# Patient Record
Sex: Female | Born: 1960 | Race: White | Hispanic: No | Marital: Married | Smoking: Never smoker
Health system: Southern US, Community
[De-identification: ages and names within clinical notes are randomized; demographics above are authoritative.]

## PROBLEM LIST (undated history)

## (undated) DIAGNOSIS — J189 Pneumonia, unspecified organism: Secondary | ICD-10-CM

## (undated) DIAGNOSIS — M3500 Sicca syndrome, unspecified: Secondary | ICD-10-CM

## (undated) DIAGNOSIS — I2510 Atherosclerotic heart disease of native coronary artery without angina pectoris: Secondary | ICD-10-CM

## (undated) HISTORY — DX: Sjogren syndrome, unspecified: M35.00

## (undated) HISTORY — DX: Pneumonia, unspecified organism: J18.9

## (undated) HISTORY — DX: Atherosclerotic heart disease of native coronary artery without angina pectoris: I25.10

---

## 1998-11-24 HISTORY — PX: NASAL SINUS SURGERY: SHX719

## 1999-01-25 ENCOUNTER — Other Ambulatory Visit: Admission: RE | Admit: 1999-01-25 | Discharge: 1999-01-25 | Payer: Self-pay | Admitting: *Deleted

## 1999-12-05 ENCOUNTER — Encounter (INDEPENDENT_AMBULATORY_CARE_PROVIDER_SITE_OTHER): Payer: Self-pay | Admitting: Specialist

## 1999-12-05 ENCOUNTER — Other Ambulatory Visit: Admission: RE | Admit: 1999-12-05 | Discharge: 1999-12-05 | Payer: Self-pay | Admitting: Otolaryngology

## 1999-12-13 ENCOUNTER — Inpatient Hospital Stay (HOSPITAL_COMMUNITY): Admission: EM | Admit: 1999-12-13 | Discharge: 1999-12-14 | Payer: Self-pay | Admitting: Emergency Medicine

## 2000-02-05 ENCOUNTER — Other Ambulatory Visit: Admission: RE | Admit: 2000-02-05 | Discharge: 2000-02-05 | Payer: Self-pay | Admitting: *Deleted

## 2000-07-13 ENCOUNTER — Encounter: Payer: Self-pay | Admitting: *Deleted

## 2000-07-13 ENCOUNTER — Encounter: Admission: RE | Admit: 2000-07-13 | Discharge: 2000-07-13 | Payer: Self-pay | Admitting: *Deleted

## 2000-09-29 ENCOUNTER — Ambulatory Visit: Admission: RE | Admit: 2000-09-29 | Discharge: 2000-09-29 | Payer: Self-pay | Admitting: Critical Care Medicine

## 2001-03-25 ENCOUNTER — Other Ambulatory Visit: Admission: RE | Admit: 2001-03-25 | Discharge: 2001-03-25 | Payer: Self-pay | Admitting: *Deleted

## 2001-08-26 ENCOUNTER — Encounter: Admission: RE | Admit: 2001-08-26 | Discharge: 2001-08-26 | Payer: Self-pay | Admitting: *Deleted

## 2001-08-26 ENCOUNTER — Encounter: Payer: Self-pay | Admitting: *Deleted

## 2001-11-26 ENCOUNTER — Encounter: Admission: RE | Admit: 2001-11-26 | Discharge: 2001-11-26 | Payer: Self-pay

## 2002-03-29 ENCOUNTER — Other Ambulatory Visit: Admission: RE | Admit: 2002-03-29 | Discharge: 2002-03-29 | Payer: Self-pay | Admitting: Obstetrics & Gynecology

## 2002-09-01 ENCOUNTER — Encounter: Payer: Self-pay | Admitting: Obstetrics & Gynecology

## 2002-09-01 ENCOUNTER — Encounter: Admission: RE | Admit: 2002-09-01 | Discharge: 2002-09-01 | Payer: Self-pay | Admitting: Obstetrics & Gynecology

## 2003-04-17 ENCOUNTER — Other Ambulatory Visit: Admission: RE | Admit: 2003-04-17 | Discharge: 2003-04-17 | Payer: Self-pay | Admitting: Obstetrics & Gynecology

## 2003-11-01 ENCOUNTER — Encounter: Admission: RE | Admit: 2003-11-01 | Discharge: 2003-11-01 | Payer: Self-pay | Admitting: Obstetrics & Gynecology

## 2004-12-04 ENCOUNTER — Encounter: Admission: RE | Admit: 2004-12-04 | Discharge: 2004-12-04 | Payer: Self-pay | Admitting: Obstetrics & Gynecology

## 2005-08-29 ENCOUNTER — Ambulatory Visit (HOSPITAL_COMMUNITY): Admission: RE | Admit: 2005-08-29 | Discharge: 2005-08-29 | Payer: Self-pay | Admitting: *Deleted

## 2005-08-29 ENCOUNTER — Encounter (INDEPENDENT_AMBULATORY_CARE_PROVIDER_SITE_OTHER): Payer: Self-pay | Admitting: Specialist

## 2005-12-30 ENCOUNTER — Encounter: Admission: RE | Admit: 2005-12-30 | Discharge: 2005-12-30 | Payer: Self-pay | Admitting: Obstetrics & Gynecology

## 2007-03-18 ENCOUNTER — Encounter: Admission: RE | Admit: 2007-03-18 | Discharge: 2007-03-18 | Payer: Self-pay | Admitting: Obstetrics & Gynecology

## 2007-03-22 ENCOUNTER — Ambulatory Visit (HOSPITAL_COMMUNITY): Admission: RE | Admit: 2007-03-22 | Discharge: 2007-03-22 | Payer: Self-pay | Admitting: *Deleted

## 2007-03-22 ENCOUNTER — Encounter (INDEPENDENT_AMBULATORY_CARE_PROVIDER_SITE_OTHER): Payer: Self-pay | Admitting: Specialist

## 2008-04-13 ENCOUNTER — Encounter: Admission: RE | Admit: 2008-04-13 | Discharge: 2008-04-13 | Payer: Self-pay | Admitting: Obstetrics & Gynecology

## 2009-04-16 ENCOUNTER — Encounter: Admission: RE | Admit: 2009-04-16 | Discharge: 2009-04-16 | Payer: Self-pay | Admitting: Obstetrics & Gynecology

## 2010-05-15 ENCOUNTER — Encounter: Admission: RE | Admit: 2010-05-15 | Discharge: 2010-05-15 | Payer: Self-pay | Admitting: Obstetrics & Gynecology

## 2011-04-11 NOTE — Op Note (Signed)
NAME:  Donna Fields, BOLDON NO.:  [id number]   MEDICAL RECORD NO.:  [id number]          PATIENT TYPE:  AMB   LOCATION:  ENDO                         FACILITY:  [facility]   PHYSICIAN:  Georgiana Spinner, M.D.    DATE OF BIRTH:  01-May-1961   DATE OF PROCEDURE:  03/22/2007  DATE OF DISCHARGE:                               OPERATIVE REPORT   PROCEDURE:  Upper endoscopy with biopsy.   INDICATIONS:  GERD.   ANESTHESIA:  Fentanyl 100 mcg, Versed 8 mg.   PROCEDURE:  With the patient mildly sedated in the left lateral  decubitus position, the Pentax videoscopic endoscope was inserted in the  mouth and passed under direct vision through the esophagus which  appeared normal on the first view.  On closer glance, there appeared to  be some areas that could be considered possibly Barrett's, but they were  photographed and biopsied.  We entered into the stomach fundus, body,  antrum, duodenal bulb, second portion duodenum appeared normal.  From  this point, the endoscope was slowly withdrawn taking circumferential  views of the duodenal mucosa until the endoscope had been pulled back in  the stomach and placed in retroflexion to view the stomach from below.  The endoscope was straightened and withdrawn taking circumferential  views of the remaining gastric and esophageal mucosa.  The patient's  vital signs and pulse oximeter remained stable.  The patient tolerated  procedure well without apparent complications.   FINDINGS:  Question of Barrett's esophagus, biopsied.  Await biopsy  report.  The patient will call me for results and follow-up with me as  an outpatient.           ______________________________  Georgiana Spinner, M.D.     GMO/MEDQ  D:  03/22/2007  T:  03/22/2007  Job:  [id number]

## 2011-04-11 NOTE — Op Note (Signed)
[place]  Patient:    Donna Fields                   MRN: [id number] Proc. Date: 12/13/99 Adm. Date:  [id number] Attending:  Barbee Cough                           Operative Report  PREOPERATIVE DIAGNOSES: 1. Postoperative hemorrhage. 2. Status post bilateral endoscopic sinus surgery (nasoseptoplasty and    turbinate cautery, performed on December 05, 1999).  POSTOPERATIVE DIAGNOSIS: 1. Postoperative hemorrhage. 2. Status post bilateral endoscopic sinus surgery (nasoseptoplasty and    turbinate cautery, performed on December 05, 1999).  SURGICAL PROCEDURE: 1. Endoscopic nasal examination. 2. Transeptal cautery. 3. Bilateral nasal packing.  ANESTHESIA:  General endotracheal.  SURGEON:  Kinnie Scales. Annalee Genta, M.D.  COMPLICATIONS:  None.  ESTIMATED BLOOD LOSS:  200 cc.  The patient transferred rom the operating room to the recovery room in stable condition.  BRIEF HISTORY:  [name] is a 50 year old white female with a history of chronic sinusitis and nasal obstruction.  She underwent nasoseptoplasty and inferior turbinate cautery on December 05, 1999 as an outpatient.  Surgical procedure was  performed without complication or difficulty and the patient was discharged to home. She did well for approximately five days after surgery, and then had several episodes of significant intermittent epistaxis -- primarily from the right hand  side.  The patient was seen in the office and postoperative packing was removed on December 12, 1999.  She awakened from sleep on the evening of December 12, 1999 with significant anterior bleeding.  Our office was contacted nd she was evaluated in emergency department.  There was heavy bleeding bilaterally from the nasal cavity as well as from the oropharynx.  Given the patients history, examination, and previous surgical history I recommended that we take her to operating room for exam and  management of epistaxis.  The risks, benefits and possible complications were discussed.  The patient and her husband understood nd concurred with our plans for surgery.  She was taken directly from the [facility] emergency department to the operating room.  DESCRIPTION OF PROCEDURE:  The patient was brought to the operating room on December 13, 1999.  General endotracheal anesthesia was established without difficulty.  When the patient had been adequately anesthetized, her nasal cavity was examined. She was found to have significant bleeding from the mid aspect of the right nasal cavity.  With a 0-degree endoscope the nasal cavity was thoroughly examined, and blood and clotted material was suctioned bilaterally.  There was a tear in the nasal septum mucosa on the right hand side in the mid aspect; this appeared to e the primary source of bleeding.  This was debrided and the specific bleeding site was not identifiable.  The bleeding appeared to be coming from the nasal septum and not from the sinuses.  Given this finding, the previously created left hemitransfixion incision was reopened and exploration was then undertaken along the mucoperichondrial flaps of the flaps of the nasoseptoplasty.  Dissection was carried from anterior to posterior, with heavy, copious bleeding.  In the posterior most aspect of the nasal septum there was an arterial bleeding source, coming primarily from the left hand side consistent with a sphenopalatine perforating artery.  This area was suctioned.  Suction cautery was then performed and Surgicel was placed over the bleeding site.  Mucoperichondrial flaps were then  re-approximated.  Nasal cavity was suctioned, and bilateral 10 cm Merocel packs  were then placed after application of Bactroban ointment, to ______ the nasal septum and to help tamponade the bleeding source.  The patients oral cavity and  oropharynx were then suctioned, and an  orogastric tube was passed and the stomach contents were aspirated.  She was awakened from her anesthetic, extubated without difficulty, and then transferred from the operating room to the recovery room in stable condition. DD:  12/13/99 TD:  12/13/99 Job: [id number]

## 2011-04-11 NOTE — Op Note (Signed)
NAME:  Donna Fields, BACKHAUS NO.:  [id number]   MEDICAL RECORD NO.:  [id number]          PATIENT TYPE:  AMB   LOCATION:  ENDO                         FACILITY:  [facility]   PHYSICIAN:  Georgiana Spinner, M.D.    DATE OF BIRTH:  31-May-1961   DATE OF PROCEDURE:  08/29/2005  DATE OF DISCHARGE:                                 OPERATIVE REPORT   PROCEDURE:  Upper endoscopy.   INDICATIONS:  Gastroesophageal reflux disease.   ANESTHESIA:  Demerol 50, Versed 6 mg.   DESCRIPTION OF PROCEDURE:  With the patient mildly sedated in the left  lateral decubitus position, the Olympus videoscopic endoscope was inserted  in the mouth and passed under direct vision through the esophagus which  appeared normal until we reached the distal esophagus and there was a  question of Barrett's photographed and biopsied. We entered into the  stomach. The fundus, body, antrum, duodenal bulb and second portion of the  duodenum were visualized. From this point, the endoscope was slowly  withdrawn taking circumferential views of the duodenal mucosa until the  endoscope had been pulled back into the stomach, placed in retroflexion to  view the stomach from below. The endoscope was straightened and withdrawn  taking circumferential views of the remaining gastric and esophageal mucosa.  The patient's vital signs and pulse oximeter remained stable. The patient  tolerated the procedure well without apparent complications.   FINDINGS:  Question of Barrett's esophagus biopsied. Await biopsy report.  The patient will call me for results and follow-up with me as an outpatient.           ______________________________  Georgiana Spinner, M.D.     GMO/MEDQ  D:  08/29/2005  T:  08/29/2005  Job:  [id number]

## 2011-04-25 ENCOUNTER — Other Ambulatory Visit: Payer: Self-pay | Admitting: Obstetrics & Gynecology

## 2011-04-25 DIAGNOSIS — Z1231 Encounter for screening mammogram for malignant neoplasm of breast: Secondary | ICD-10-CM

## 2011-05-13 ENCOUNTER — Emergency Department (HOSPITAL_COMMUNITY)
Admission: EM | Admit: 2011-05-13 | Discharge: 2011-05-14 | Disposition: A | Discharge status: Home / Self Care | Payer: 59 | Attending: Emergency Medicine | Admitting: Emergency Medicine

## 2011-05-13 DIAGNOSIS — M329 Systemic lupus erythematosus, unspecified: Secondary | ICD-10-CM | POA: Insufficient documentation

## 2011-05-13 DIAGNOSIS — M3500 Sicca syndrome, unspecified: Secondary | ICD-10-CM | POA: Insufficient documentation

## 2011-05-13 DIAGNOSIS — Z79899 Other long term (current) drug therapy: Secondary | ICD-10-CM | POA: Insufficient documentation

## 2011-05-13 DIAGNOSIS — J3489 Other specified disorders of nose and nasal sinuses: Secondary | ICD-10-CM | POA: Insufficient documentation

## 2011-05-13 DIAGNOSIS — J45909 Unspecified asthma, uncomplicated: Secondary | ICD-10-CM | POA: Insufficient documentation

## 2011-05-13 DIAGNOSIS — R040 Epistaxis: Secondary | ICD-10-CM | POA: Insufficient documentation

## 2011-05-19 ENCOUNTER — Ambulatory Visit
Admission: RE | Admit: 2011-05-19 | Discharge: 2011-05-19 | Disposition: A | Discharge status: Home / Self Care | Payer: 59 | Source: Ambulatory Visit | Attending: Obstetrics & Gynecology | Admitting: Obstetrics & Gynecology

## 2011-05-19 DIAGNOSIS — Z1231 Encounter for screening mammogram for malignant neoplasm of breast: Secondary | ICD-10-CM

## 2012-06-29 ENCOUNTER — Other Ambulatory Visit: Payer: Self-pay | Admitting: Obstetrics & Gynecology

## 2012-06-29 DIAGNOSIS — Z1231 Encounter for screening mammogram for malignant neoplasm of breast: Secondary | ICD-10-CM

## 2012-07-02 ENCOUNTER — Ambulatory Visit
Admission: RE | Admit: 2012-07-02 | Discharge: 2012-07-02 | Disposition: A | Discharge status: Home / Self Care | Payer: 59 | Source: Ambulatory Visit | Attending: Obstetrics & Gynecology | Admitting: Obstetrics & Gynecology

## 2012-07-02 DIAGNOSIS — Z1231 Encounter for screening mammogram for malignant neoplasm of breast: Secondary | ICD-10-CM

## 2012-07-07 ENCOUNTER — Other Ambulatory Visit: Payer: Self-pay | Admitting: Obstetrics & Gynecology

## 2012-07-07 DIAGNOSIS — R928 Other abnormal and inconclusive findings on diagnostic imaging of breast: Secondary | ICD-10-CM

## 2012-07-14 ENCOUNTER — Ambulatory Visit
Admission: RE | Admit: 2012-07-14 | Discharge: 2012-07-14 | Disposition: A | Discharge status: Home / Self Care | Payer: 59 | Source: Ambulatory Visit | Attending: Obstetrics & Gynecology | Admitting: Obstetrics & Gynecology

## 2012-07-14 ENCOUNTER — Ambulatory Visit
Admission: RE | Admit: 2012-07-14 | Discharge: 2012-07-14 | Disposition: A | Discharge status: Home / Self Care | Payer: 59 | Source: Ambulatory Visit

## 2012-07-14 DIAGNOSIS — R928 Other abnormal and inconclusive findings on diagnostic imaging of breast: Secondary | ICD-10-CM

## 2012-07-20 ENCOUNTER — Other Ambulatory Visit: Payer: Self-pay | Admitting: Obstetrics & Gynecology

## 2012-07-20 DIAGNOSIS — R928 Other abnormal and inconclusive findings on diagnostic imaging of breast: Secondary | ICD-10-CM

## 2012-10-13 ENCOUNTER — Other Ambulatory Visit (HOSPITAL_COMMUNITY): Payer: Self-pay | Admitting: Internal Medicine

## 2012-10-13 DIAGNOSIS — R0602 Shortness of breath: Secondary | ICD-10-CM

## 2012-10-19 ENCOUNTER — Ambulatory Visit (HOSPITAL_COMMUNITY)
Admission: RE | Admit: 2012-10-19 | Discharge: 2012-10-19 | Disposition: A | Discharge status: Home / Self Care | Payer: 59 | Source: Ambulatory Visit | Attending: Internal Medicine | Admitting: Internal Medicine

## 2012-10-19 DIAGNOSIS — R0602 Shortness of breath: Secondary | ICD-10-CM | POA: Insufficient documentation

## 2012-10-19 MED ORDER — ALBUTEROL SULFATE (5 MG/ML) 0.5% IN NEBU
2.5000 mg | INHALATION_SOLUTION | Freq: Once | RESPIRATORY_TRACT | Status: AC
  Administered 2012-10-19: 2.5 mg via RESPIRATORY_TRACT

## 2013-06-13 ENCOUNTER — Other Ambulatory Visit: Payer: Self-pay

## 2013-06-13 DIAGNOSIS — Z1231 Encounter for screening mammogram for malignant neoplasm of breast: Secondary | ICD-10-CM

## 2013-07-05 ENCOUNTER — Ambulatory Visit
Admission: RE | Admit: 2013-07-05 | Discharge: 2013-07-05 | Disposition: A | Discharge status: Home / Self Care | Payer: 59 | Source: Ambulatory Visit

## 2013-07-05 DIAGNOSIS — Z1231 Encounter for screening mammogram for malignant neoplasm of breast: Secondary | ICD-10-CM

## 2014-06-16 ENCOUNTER — Other Ambulatory Visit: Payer: Self-pay

## 2014-06-16 DIAGNOSIS — Z1231 Encounter for screening mammogram for malignant neoplasm of breast: Secondary | ICD-10-CM

## 2014-07-12 ENCOUNTER — Ambulatory Visit
Admission: RE | Admit: 2014-07-12 | Discharge: 2014-07-12 | Disposition: A | Discharge status: Home / Self Care | Payer: 59 | Source: Ambulatory Visit

## 2014-07-12 DIAGNOSIS — Z1231 Encounter for screening mammogram for malignant neoplasm of breast: Secondary | ICD-10-CM

## 2015-07-04 ENCOUNTER — Other Ambulatory Visit: Payer: Self-pay

## 2015-07-04 DIAGNOSIS — Z1231 Encounter for screening mammogram for malignant neoplasm of breast: Secondary | ICD-10-CM

## 2015-08-14 ENCOUNTER — Ambulatory Visit
Admission: RE | Admit: 2015-08-14 | Discharge: 2015-08-14 | Disposition: A | Discharge status: Home / Self Care | Payer: BC Managed Care – PPO | Source: Ambulatory Visit

## 2015-08-14 DIAGNOSIS — Z1231 Encounter for screening mammogram for malignant neoplasm of breast: Secondary | ICD-10-CM

## 2015-10-11 ENCOUNTER — Other Ambulatory Visit: Payer: Self-pay | Admitting: Surgery

## 2015-10-29 ENCOUNTER — Other Ambulatory Visit: Payer: Self-pay | Admitting: Endocrinology

## 2015-10-29 DIAGNOSIS — R918 Other nonspecific abnormal finding of lung field: Secondary | ICD-10-CM

## 2015-10-29 DIAGNOSIS — R059 Cough, unspecified: Secondary | ICD-10-CM

## 2015-10-29 DIAGNOSIS — R05 Cough: Secondary | ICD-10-CM

## 2015-11-06 ENCOUNTER — Other Ambulatory Visit: Payer: Self-pay | Admitting: Gastroenterology

## 2015-11-07 ENCOUNTER — Ambulatory Visit
Admission: RE | Admit: 2015-11-07 | Discharge: 2015-11-07 | Disposition: A | Discharge status: Home / Self Care | Payer: BC Managed Care – PPO | Source: Ambulatory Visit | Attending: Endocrinology | Admitting: Endocrinology

## 2015-11-07 DIAGNOSIS — R918 Other nonspecific abnormal finding of lung field: Secondary | ICD-10-CM

## 2015-11-07 DIAGNOSIS — R059 Cough, unspecified: Secondary | ICD-10-CM

## 2015-11-07 DIAGNOSIS — R05 Cough: Secondary | ICD-10-CM

## 2015-11-07 MED ORDER — IOPAMIDOL (ISOVUE-300) INJECTION 61%
75.0000 mL | Freq: Once | INTRAVENOUS | Status: AC | PRN
  Administered 2015-11-07: 75 mL via INTRAVENOUS

## 2015-11-09 ENCOUNTER — Encounter: Payer: Self-pay | Admitting: Internal Medicine

## 2015-11-09 ENCOUNTER — Ambulatory Visit (INDEPENDENT_AMBULATORY_CARE_PROVIDER_SITE_OTHER): Payer: BC Managed Care – PPO | Admitting: Internal Medicine

## 2015-11-09 VITALS — BP 118/88 | HR 111 | Ht 63.0 in | Wt 178.0 lb

## 2015-11-09 DIAGNOSIS — J842 Lymphoid interstitial pneumonia: Secondary | ICD-10-CM | POA: Diagnosis not present

## 2015-11-09 NOTE — Progress Notes (Signed)
Subjective:    Patient ID: Donna Fields, female    DOB: 08-Feb-1961,    MRN: [id number]  HPI   37 yowf never smoker prev eval in pulmonary for sob but not in EMR era for doe with neg findings per pt except for obesity/decondtioning  Referred back to pulmonary clinic by Dr [name]  11/09/2015 for progressively worse ex sob/abn cxr c/w sjogren's syndrome with assoc ILD    11/09/2015 1st Donna Fields/ [facility]   Chief Complaint  Patient presents with  . Pulmonary Consult    Referred by Dr. [name] for eval of pulmonary nodules. Pt states that she has been SOB "for years"- worse with exertion and exposure to extreme hot or cold temps.   dx'd with Sjogren's around 2000 by Dr Justine Null main symptom was fatigue/ dry mouth rx plaquenil/ now followed by [name]  Indolent onset progressively worsening dry Cough and doe x 5 years, Cough is worse lying down or with exercise.  Was able to slow jog at onset, now [facility] = can walk nl pace, flat grade, can't hurry or go uphills or steps s sob , worse in cold air   No obvious other patterns in day to day or daytime variabilty or assoc  cp or chest tightness, subjective wheeze overt sinus or hb symptoms. No unusual exp hx or h/o childhood pna/ asthma or knowledge of premature birth.  Sleeping ok without nocturnal  or early am exacerbation  of respiratory  c/o's or need for noct saba. Also denies any obvious fluctuation of symptoms with weather or environmental changes or other aggravating or alleviating factors except as outlined above   Current Medications, Allergies, Complete Past Medical History, Past Surgical History, Family History, and Social History were reviewed in [employer] record.               Review of Systems  Constitutional: Negative for fever, chills and unexpected weight change.  HENT: Negative for congestion, dental problem, ear pain, nosebleeds, postnasal drip, rhinorrhea, sinus pressure,  sneezing, sore throat, trouble swallowing and voice change.   Eyes: Negative for visual disturbance.  Respiratory: Positive for cough and shortness of breath. Negative for choking.   Cardiovascular: Negative for chest pain and leg swelling.  Gastrointestinal: Negative for vomiting, abdominal pain and diarrhea.  Genitourinary: Negative for difficulty urinating.  Musculoskeletal: Negative for arthralgias.  Skin: Negative for rash.  Neurological: Negative for tremors, syncope and headaches.  Hematological: Does not bruise/bleed easily.       Objective:   Physical Exam amb wm nad   Wt Readings from Last 3 Encounters:  11/09/15 178 lb (80.74 kg)    Vital signs reviewed   HEENT: nl dentition, turbinates, and oropharynx. Nl external ear canals without cough reflex   NECK :  without JVD/Nodes/TM/ nl carotid upstrokes bilaterally   LUNGS: no acc muscle use,  Nl contour chest which is clear to A and P bilaterally without cough on insp or exp maneuvers   CV:  RRR  no s3 or murmur or increase in P2, no edema   ABD:  soft and nontender with nl inspiratory excursion in the supine position. No bruits or organomegaly, bowel sounds nl  MS:  Nl gait/ ext warm without deformities, calf tenderness, cyanosis or clubbing No obvious joint restrictions   SKIN: warm and dry without lesions    NEURO:  alert, approp, nl sensorium with  no motor deficits      I personally  reviewed images and agree with radiology impression as follows:  CT Chest   11/07/15  Multiple thin wall pulmonary cysts of varying sizes with slight mid to lower lung predominance as well as scattered bilateral nodular opacities. 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

## 2015-11-09 NOTE — Patient Instructions (Addendum)
Take prilosec 20 mg Take 30-60 min before first meal of the day every day until you return   GERD (REFLUX)  is an extremely common cause of respiratory symptoms just like yours , many times with no obvious heartburn at all.    It can be treated with medication, but also with lifestyle changes including elevation of the head of your bed (ideally with 6 inch  bed blocks),  Smoking cessation, avoidance of late meals, excessive alcohol, and avoid fatty foods, chocolate, peppermint, colas, red wine, and acidic juices such as orange juice.  NO MINT OR MENTHOL PRODUCTS SO NO COUGH DROPS  USE SUGARLESS CANDY INSTEAD (Jolley ranchers or Stover's or Life Savers) or even ice chips will also do - the key is to swallow to prevent all throat clearing. NO OIL BASED VITAMINS - use powdered substitutes.    Please schedule a follow up office visit in 6 weeks, call sooner if needed - pft's and walking sats on return

## 2015-11-10 ENCOUNTER — Encounter: Payer: Self-pay | Admitting: Internal Medicine

## 2015-11-10 DIAGNOSIS — J842 Lymphoid interstitial pneumonia: Secondary | ICD-10-CM | POA: Insufficient documentation

## 2015-11-10 MED ORDER — OMEPRAZOLE 20 MG PO CPDR: 20.0000 mg | DELAYED_RELEASE_CAPSULE | Freq: Every day | ORAL | Status: DC

## 2015-11-10 NOTE — Assessment & Plan Note (Addendum)
Onset of Sjogren's in 2000/ maint rx plaquenil / f/u by [facility] - see CT chest 11/07/15   .  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  There is a risk of cyst rupture with extremes of altitude (not commercial flight typically but certainly with scuba diving) but little else of concern in the short run.  She should return p holidays for full pfts to quantify severity of underlying ILD and r/o any sign airflow obst which might be potentially reversible.  In addition, Use of PPI is associated with improved survival time and with decreased radiologic fibrosis per King's study published in [facility] vol 184 p1390.  Dec 2011 and also may have other beneficial effects as per the latest review in [facility] vol 193 [id number] Jun 20016.  This may not always be cause and effect, but given how universally unimpressive and expensive  all the other  Drugs developed to day  have been for pf,   rec start  rx ppi / diet/ lifestyle modification and f/u with serial walking sats and lung volumes for now to put more points on the curve / establish firm baseline before considering additional measures.   Total time devoted to counseling  = 35/22m ov :  review case with pt/ discussion of options/alternatives/ giving and going over instructions (see avs)

## 2015-11-12 ENCOUNTER — Telehealth: Payer: Self-pay | Admitting: *Deleted

## 2015-11-12 DIAGNOSIS — J842 Lymphoid interstitial pneumonia: Secondary | ICD-10-CM

## 2015-11-12 NOTE — Telephone Encounter (Signed)
-----   Message from Tanda Rockers, MD sent at 11/10/2015  7:33 AM EST ----- Meant to order pfts on return

## 2015-11-12 NOTE — Telephone Encounter (Signed)
LMTCB

## 2015-11-13 NOTE — Telephone Encounter (Signed)
pft schedule in office for that day is 100% booked.  Will have to be done at hospital. lmtcb X2 for pt. Will order pft after speaking to pt.

## 2015-11-14 NOTE — Telephone Encounter (Signed)
LMTCB for pt 

## 2015-11-15 NOTE — Telephone Encounter (Signed)
LMTCB for pt on VM

## 2015-11-16 NOTE — Telephone Encounter (Signed)
Called and spoke to pt. Informed her of the need for PFTs. Pt scheduled for full PFT on 12/21/2015, pt has appt with MW on 12/24/15. Pt verbalized understanding and denied any further questions or concerns at this time.

## 2015-12-11 ENCOUNTER — Institutional Professional Consult (permissible substitution): Payer: BC Managed Care – PPO | Admitting: Internal Medicine

## 2015-12-20 ENCOUNTER — Ambulatory Visit (INDEPENDENT_AMBULATORY_CARE_PROVIDER_SITE_OTHER): Payer: BC Managed Care – PPO | Admitting: Internal Medicine

## 2015-12-20 DIAGNOSIS — J842 Lymphoid interstitial pneumonia: Secondary | ICD-10-CM | POA: Diagnosis not present

## 2015-12-20 DIAGNOSIS — J449 Chronic obstructive pulmonary disease, unspecified: Secondary | ICD-10-CM

## 2015-12-20 HISTORY — DX: Chronic obstructive pulmonary disease, unspecified: J44.9

## 2015-12-20 LAB — PULMONARY FUNCTION TEST
DL/VA % PRED: 85 %
DL/VA: 4.01 ml/min/mmHg/L
DLCO UNC % PRED: 78 %
DLCO UNC: 18.09 ml/min/mmHg
FEF 25-75 POST: 1.69 L/s
FEF 25-75 PRE: 1.2 L/s
FEF2575-%Change-Post: 40 %
FEF2575-%PRED-POST: 68 %
FEF2575-%Pred-Pre: 49 %
FEV1-%Change-Post: 7 %
FEV1-%Pred-Post: 79 %
FEV1-%Pred-Pre: 73 %
FEV1-POST: 2.03 L
FEV1-Pre: 1.88 L
FEV1FVC-%Change-Post: 5 %
FEV1FVC-%Pred-Pre: 91 %
FEV6-%CHANGE-POST: 2 %
FEV6-%PRED-POST: 83 %
FEV6-%Pred-Pre: 81 %
FEV6-Post: 2.67 L
FEV6-Pre: 2.59 L
FEV6FVC-%CHANGE-POST: 1 %
FEV6FVC-%Pred-Post: 102 %
FEV6FVC-%Pred-Pre: 101 %
FVC-%Change-Post: 1 %
FVC-%PRED-POST: 81 %
FVC-%Pred-Pre: 79 %
FVC-Post: 2.67 L
FVC-Pre: 2.62 L
POST FEV1/FVC RATIO: 76 %
PRE FEV1/FVC RATIO: 72 %
Post FEV6/FVC ratio: 100 %
Pre FEV6/FVC Ratio: 99 %
RV % PRED: 137 %
RV: 2.55 L
TLC % pred: 105 %
TLC: 5.17 L

## 2015-12-20 NOTE — Progress Notes (Signed)
PFT done today. 

## 2015-12-24 ENCOUNTER — Encounter: Payer: Self-pay | Admitting: Internal Medicine

## 2015-12-24 ENCOUNTER — Ambulatory Visit (INDEPENDENT_AMBULATORY_CARE_PROVIDER_SITE_OTHER): Payer: BC Managed Care – PPO | Admitting: Internal Medicine

## 2015-12-24 VITALS — BP 130/84 | HR 96 | Ht 63.0 in | Wt 182.2 lb

## 2015-12-24 DIAGNOSIS — J842 Lymphoid interstitial pneumonia: Secondary | ICD-10-CM

## 2015-12-24 NOTE — Progress Notes (Signed)
Subjective:    Patient ID: Donna Fields, female    DOB: 1961-10-02    MRN: [id number]     Brief patient profile:  1 yowf never smoker prev eval in pulmonary for sob but not in EMR era for doe with neg findings per pt except for obesity/decondtioning  Referred back to pulmonary clinic by Dr Donna Fields  11/09/2015 for progressively worse ex sob/abn cxr c/w sjogren's syndrome with assoc ILD     History of Present Illness  11/09/2015 1st [facility] office visit/ Donna Fields   Chief Complaint  Patient presents with  . Pulmonary Consult    Referred by Dr. Jani Fields for eval of pulmonary nodules. Pt states that she has been SOB "for years"- worse with exertion and exposure to extreme hot or cold temps.   dx'd with Sjogren's around 2000 by Dr Donna Fields main symptom was fatigue/ dry mouth rx plaquenil/ now followed by Donna Fields  Indolent onset progressively worsening dry Cough and doe x 5 years, Cough is worse lying down or with exercise.  Was able to slow jog at onset, now [facility] = can walk nl pace, flat grade, can't hurry or go uphills or steps s sob , worse in cold air  rec Take prilosec 20 mg Take 30-60 min before first meal of the day every day until you return  GERD  Diet   12/24/2015  f/u ov/Donna Fields re:  LIP/ ? ILD from sjogren's maint on plaquenil  Chief Complaint  Patient presents with  . Follow-up    Breathing is unchanged. No new co's today.    Not limited by breathing from desired activities    No obvious day to day or daytime variability or assoc chronic cough or cp or chest tightness, subjective wheeze or overt sinus or hb symptoms. No unusual exp hx or h/o childhood pna/ asthma or knowledge of premature birth.  Sleeping ok without nocturnal  or early am exacerbation  of respiratory  c/o's or need for noct saba. Also denies any obvious fluctuation of symptoms with weather or environmental changes or other aggravating or alleviating factors except as outlined above   Current  Medications, Allergies, Complete Past Medical History, Past Surgical History, Family History, and Social History were reviewed in [employer] record.  ROS  The following are not active complaints unless bolded sore throat, dysphagia, dental problems, itching, sneezing,  nasal congestion or excess/ purulent secretions, ear ache,   fever, chills, sweats, unintended wt loss, classically pleuritic or exertional cp, hemoptysis,  orthopnea pnd or leg swelling, presyncope, palpitations, abdominal pain, anorexia, nausea, vomiting, diarrhea  or change in bowel or bladder habits, change in stools or urine, dysuria,hematuria,  rash, arthralgias, visual complaints, headache, numbness, weakness or ataxia or problems with walking or coordination,  change in mood/affect or memory.                Objective:   Physical Exam   amb wm nad   Wt Readings from Last 3 Encounters:  12/24/15 182 lb 3.2 oz (82.645 kg)  11/09/15 178 lb (80.74 kg)    Vital signs reviewed   HEENT: nl dentition, turbinates, and oropharynx. Nl external ear canals without cough reflex   NECK :  without JVD/Nodes/TM/ nl carotid upstrokes bilaterally   LUNGS: no acc muscle use,  Nl contour chest which is clear to A and P bilaterally without cough on insp or exp maneuvers   CV:  RRR  no s3 or murmur or increase  in P2, no edema   ABD:  soft and nontender with nl inspiratory excursion in the supine position. No bruits or organomegaly, bowel sounds nl  MS:  Nl gait/ ext warm without deformities, calf tenderness, cyanosis or clubbing No obvious joint restrictions   SKIN: warm and dry without lesions    NEURO:  alert, approp, nl sensorium with  no motor deficits      I personally reviewed images and agree with radiology impression as follows:  CT Chest   11/07/15  Multiple thin wall pulmonary cysts of varying sizes with slight mid to lower lung predominance as well as scattered bilateral  nodular opacities. 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

## 2015-12-24 NOTE — Patient Instructions (Signed)
Ok to stop prilosec   Weight control is simply a matter of calorie balance which needs to be tilted in your favor by eating less and exercising more.  To get the most out of exercise, you need to be continuously aware that you are short of breath, but never out of breath, for 30 minutes daily. As you improve, it will actually be easier for you to do the same amount of exercise  in  30 minutes so always push to the level where you are short of breath.  If this does not result in gradual weight reduction then I strongly recommend you see a nutritionist with a food diary x 2 weeks so that we can work out a negative calorie balance which is universally effective in steady weight loss programs.  Think of your calorie balance like you do your bank account where in this case you want the balance to go down so you must take in less calories than you burn up.  It's just that simple:  Hard to do, but easy to understand.  Good luck!   Please schedule a follow up visit in  6 months but call sooner if needed and with pft's and cxr

## 2015-12-26 ENCOUNTER — Encounter: Payer: Self-pay | Admitting: Internal Medicine

## 2015-12-26 NOTE — Assessment & Plan Note (Signed)
Onset of Sjogren's in 2000/ maint rx plaquenil / f/u by [facility] -CT Chest   11/07/15  Multiple thin wall pulmonary cysts of varying sizes with slight mid to lower lung predominance as well as scattered bilateral nodular opacities. 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  Clinically she's doing fine.   rec repeat pfts/ cxr in 6 m then prn sob    I had an extended discussion with the patient reviewing all relevant studies completed to date and  lasting 15 to 20 minutes of a 25 minute visit on the following ongoing concerns:   Discussed in detail all the  indications, usual  risks and alternatives  relative to the benefits with patient who agrees to proceed with conservative f/u as outlined

## 2015-12-27 ENCOUNTER — Telehealth: Payer: Self-pay | Admitting: *Deleted

## 2015-12-27 ENCOUNTER — Telehealth: Payer: Self-pay | Admitting: Internal Medicine

## 2015-12-27 NOTE — Telephone Encounter (Signed)
error 

## 2015-12-27 NOTE — Telephone Encounter (Signed)
We need to know who her rheum is so that we can send her last note  I spoke with the pt's spouse  He will find out and call us tomorrow  He states that the pt was upset after her last visit b/c she feels that not enough time was spent with her  Will forward to MW so he is aware

## 2015-12-27 NOTE — Telephone Encounter (Signed)
Discussed with husband / pt upset re internet search, reassured that's not necessarily applicable to her case and we'll know more when she returns at the 6 m mark and welcomed him to be there.

## 2016-06-23 ENCOUNTER — Ambulatory Visit: Payer: BC Managed Care – PPO | Admitting: Internal Medicine

## 2016-06-30 ENCOUNTER — Ambulatory Visit: Payer: BC Managed Care – PPO | Admitting: Internal Medicine

## 2016-07-25 ENCOUNTER — Ambulatory Visit (INDEPENDENT_AMBULATORY_CARE_PROVIDER_SITE_OTHER)
Admission: RE | Admit: 2016-07-25 | Discharge: 2016-07-25 | Disposition: A | Discharge status: Home / Self Care | Payer: BC Managed Care – PPO | Source: Ambulatory Visit | Attending: Internal Medicine | Admitting: Internal Medicine

## 2016-07-25 ENCOUNTER — Other Ambulatory Visit: Payer: Self-pay | Admitting: Internal Medicine

## 2016-07-25 ENCOUNTER — Encounter (INDEPENDENT_AMBULATORY_CARE_PROVIDER_SITE_OTHER): Payer: BC Managed Care – PPO | Admitting: Internal Medicine

## 2016-07-25 ENCOUNTER — Encounter: Payer: Self-pay | Admitting: Internal Medicine

## 2016-07-25 ENCOUNTER — Ambulatory Visit (INDEPENDENT_AMBULATORY_CARE_PROVIDER_SITE_OTHER): Payer: BC Managed Care – PPO | Admitting: Internal Medicine

## 2016-07-25 VITALS — BP 130/86 | HR 90 | Ht 63.0 in | Wt 168.0 lb

## 2016-07-25 DIAGNOSIS — J842 Lymphoid interstitial pneumonia: Secondary | ICD-10-CM | POA: Diagnosis not present

## 2016-07-25 DIAGNOSIS — R0600 Dyspnea, unspecified: Secondary | ICD-10-CM

## 2016-07-25 DIAGNOSIS — J45991 Cough variant asthma: Secondary | ICD-10-CM | POA: Diagnosis not present

## 2016-07-25 LAB — NITRIC OXIDE: NITRIC OXIDE: 111

## 2016-07-25 MED ORDER — BUDESONIDE-FORMOTEROL FUMARATE 80-4.5 MCG/ACT IN AERO: INHALATION_SPRAY | RESPIRATORY_TRACT | 11 refills | Status: DC

## 2016-07-25 MED ORDER — BUDESONIDE-FORMOTEROL FUMARATE 80-4.5 MCG/ACT IN AERO: 2.0000 | INHALATION_SPRAY | Freq: Two times a day (BID) | RESPIRATORY_TRACT | 0 refills | Status: DC

## 2016-07-25 NOTE — Patient Instructions (Signed)
Start symbicort 80 Take 2 puffs first thing in am and then another 2 puffs about 12 hours later.   Work on inhaler technique:  relax and gently blow all the way out then take a nice smooth deep breath back in, triggering the inhaler at same time you start breathing in.  Hold for up to 5 seconds if you can. Blow out thru nose. Rinse and gargle with water when done   Please remember to go to the  x-ray department downstairs for your tests - we will call you with the results when they are available.       Please schedule a follow up visit in 3 months but call sooner if needed

## 2016-07-25 NOTE — Progress Notes (Signed)
Subjective:    Patient ID: Donna Fields, female    DOB: [birth year]    MRN: [id number]     Brief patient profile:  15 yowf never smoker prev eval in pulmonary for sob but not in EMR era for doe with neg findings per pt except for obesity/decondtioning  Referred back to pulmonary clinic by Dr [name]  11/09/2015 for progressively worse ex sob/abn cxr c/w sjogren's syndrome with assoc ILD     History of Present Illness  11/09/2015 1st [place] [facility] office visit/ Aleli Navedo   Chief Complaint  Patient presents with  . Pulmonary Consult    Referred by Dr. [name] for eval of pulmonary nodules. Pt states that she has been SOB "for years"- worse with exertion and exposure to extreme hot or cold temps.   dx'd with Sjogren's around 2000 by Dr Justine Null main symptom was fatigue/ dry mouth rx plaquenil/ now followed by [name]  Indolent onset progressively worsening dry Cough and doe x 5 years, Cough is worse lying down or with exercise.  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 Follow-up    Breathing is unchanged. No new co's today.   Not limited by breathing from desired activities   rec Ok to stop prilosec     07/25/2016  f/u ov/Kimber Fritts re: LIP/ / ILD  Chief Complaint  Patient presents with  . Follow-up    PFT's done today. Breathing is unchanged. She has occ non prod cough and chest tightness.  She uses albuterol inhaler 2-3 x per wk on average.   stopped plaquenil months prior to OV  And reduced the prilosec to qod Can not vacuum even short spurts s sob which is new x 6 months, gradually worse/ never tried saba before exertion No noct symptoms, cough more with  exertion in or outside    No obvious day to day or daytime variability or assoc chronic cough or cp or chest tightness, subjective wheeze or overt sinus or hb symptoms. No unusual exp hx or h/o childhood pna/ asthma or knowledge of premature birth.  Sleeping ok without nocturnal  or early am exacerbation  of respiratory  c/o's or need for noct saba. Also denies any obvious fluctuation of symptoms with weather or environmental changes or other aggravating or alleviating factors except as outlined above   Current Medications, Allergies, Complete Past Medical History, Past Surgical History, Family History, and Social History were reviewed in [employer] record.  ROS  The following are not active complaints unless bolded sore throat, dysphagia, dental problems, itching, sneezing,  nasal congestion or excess/ purulent secretions, ear ache,   fever, chills, sweats, unintended wt loss, classically pleuritic or exertional cp, hemoptysis,  orthopnea pnd or leg swelling, presyncope, palpitations, abdominal pain, anorexia, nausea, vomiting, diarrhea  or change in bowel or bladder habits, change in stools or urine, dysuria,hematuria,  rash, arthralgias, visual complaints, headache, numbness, weakness or ataxia or problems with walking or coordination,  change in mood/affect or memory.  Objective:   Physical Exam   amb wm nad    07/25/2016        168   12/24/15 182 lb 3.2 oz (82.645 kg)  11/09/15 178 lb (80.74 kg)    Vital signs reviewed   HEENT: nl dentition, turbinates, and oropharynx. Nl external ear canals without cough reflex   NECK :  without JVD/Nodes/TM/ nl carotid upstrokes bilaterally   LUNGS: no acc muscle use,  Nl contour chest which is clear to A and P bilaterally without cough on insp or exp maneuvers   CV:  RRR  no s3 or murmur or increase in P2, no edema   ABD:  soft and nontender with nl inspiratory excursion in the supine position. No  bruits or organomegaly, bowel sounds nl  MS:  Nl gait/ ext warm without deformities, calf tenderness, cyanosis or clubbing No obvious joint restrictions   SKIN: warm and dry without lesions    NEURO:  alert, approp, nl sensorium with  no motor deficits      I personally reviewed images and agree with radiology impression as follows:  CT Chest   11/07/15  Multiple thin wall pulmonary cysts of varying sizes with slight mid to lower lung predominance as well as scattered bilateral nodular opacities. 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 No acute cardiopulmonary process is identified. 2. Cystic and nodular changes of lung parenchyma are similar to prior chest radiograph and better characterized on prior chest CT      Assessment & Plan:

## 2016-07-26 NOTE — Assessment & Plan Note (Addendum)
FENO 07/25/2016  =   111 -  07/25/2016  After extensive coaching HFA effectiveness =    75% > try symbicort 80 2bid  She improved over 200 cc p albuterol today and this plus the FENO elevation and the reported clinical improvement after saba all point to occult asthma with suggestion of eos airways inflammation so reasonable to challenge with maint rx  And see what difference this makes in her symptoms  .I had an extended discussion with the patient reviewing all relevant studies completed to date and  lasting  25 minutes of a 40  minute visit    Each maintenance medication was reviewed in detail including most importantly the difference between maintenance and prns and under what circumstances the prns are to be triggered using an action plan format that is not reflected in the computer generated alphabetically organized AVS.    Please see instructions for details which were reviewed in writing and the patient given a copy highlighting the part that I personally wrote and discussed at today's ov.

## 2016-07-29 NOTE — Progress Notes (Signed)
LMTCB

## 2016-07-30 ENCOUNTER — Telehealth: Payer: Self-pay | Admitting: Internal Medicine

## 2016-07-30 NOTE — Telephone Encounter (Signed)
Call pt: Reviewed cxr and no acute change so no change in recommendations made at [facility] and spoke with pt and she is aware of cxr results per MW.  Nothing further is needed.

## 2016-07-31 LAB — PULMONARY FUNCTION TEST
DL/VA % PRED: 86 %
DL/VA: 4.05 ml/min/mmHg/L
DLCO UNC % PRED: 77 %
DLCO UNC: 17.64 ml/min/mmHg
DLCO cor % pred: 78 %
DLCO cor: 17.92 ml/min/mmHg
FEF 25-75 PRE: 1.03 L/s
FEF 25-75 Post: 1.77 L/sec
FEF2575-%Change-Post: 71 %
FEF2575-%Pred-Post: 72 %
FEF2575-%Pred-Pre: 42 %
FEV1-%CHANGE-POST: 13 %
FEV1-%PRED-PRE: 64 %
FEV1-%Pred-Post: 73 %
FEV1-POST: 1.86 L
FEV1-Pre: 1.63 L
FEV1FVC-%Change-Post: 4 %
FEV1FVC-%Pred-Pre: 91 %
FEV6-%CHANGE-POST: 10 %
FEV6-%PRED-POST: 78 %
FEV6-%PRED-PRE: 70 %
FEV6-Post: 2.47 L
FEV6-Pre: 2.22 L
FEV6FVC-%CHANGE-POST: 1 %
FEV6FVC-%PRED-POST: 103 %
FEV6FVC-%PRED-PRE: 101 %
FVC-%Change-Post: 9 %
FVC-%Pred-Post: 75 %
FVC-%Pred-Pre: 69 %
FVC-Post: 2.47 L
POST FEV6/FVC RATIO: 100 %
Post FEV1/FVC ratio: 75 %
Pre FEV1/FVC ratio: 72 %
Pre FEV6/FVC Ratio: 98 %
RV % PRED: 134 %
RV: 2.51 L
TLC % pred: 98 %
TLC: 4.82 L

## 2016-08-14 ENCOUNTER — Telehealth: Payer: Self-pay | Admitting: Internal Medicine

## 2016-08-14 NOTE — Telephone Encounter (Signed)
Received fax from [place] stating that med was approved  Fax sent to pharm to inform them

## 2016-08-14 NOTE — Telephone Encounter (Signed)
PA initiated through [facility] for Symbicort 80 Key: [place] information has been submitted to [employer]. To check for an updated outcome later, reopen this PA request from your dashboard. If [employer] has not responded to your request within 24 hours, contact [place] at [phone]. If you think there may be a problem with your PA request, use our live chat feature at the bottom right.

## 2016-10-24 ENCOUNTER — Ambulatory Visit (INDEPENDENT_AMBULATORY_CARE_PROVIDER_SITE_OTHER): Payer: BC Managed Care – PPO | Admitting: Internal Medicine

## 2016-10-24 ENCOUNTER — Encounter: Payer: Self-pay | Admitting: Internal Medicine

## 2016-10-24 VITALS — BP 122/78 | HR 99 | Ht 63.0 in | Wt 173.4 lb

## 2016-10-24 DIAGNOSIS — J842 Lymphoid interstitial pneumonia: Secondary | ICD-10-CM | POA: Diagnosis not present

## 2016-10-24 DIAGNOSIS — J45991 Cough variant asthma: Secondary | ICD-10-CM

## 2016-10-24 NOTE — Progress Notes (Addendum)
Subjective:    Patient ID: Donna Fields, female    DOB: 11/13/1961    MRN: [id number]     Brief patient profile:  12 yowf never smoker prev eval in pulmonary for sob but not in EMR era for doe with neg findings per pt except for obesity/decondtioning   Referred back to pulmonary clinic by Dr [name]  11/09/2015 for progressively worse ex sob/abn cxr c/w sjogren's syndrome with assoc ILD     History of Present Illness  11/09/2015 1st [place] [facility] office visit/ Rai Sinagra   Chief Complaint  Patient presents with  . Pulmonary Consult    Referred by Dr. [name] for eval of pulmonary nodules. Pt states that she has been SOB "for years"- worse with exertion and exposure to extreme hot or cold temps.   dx'd with Sjogren's around 2000 by Dr Justine Null main symptom was fatigue/ dry mouth rx plaquenil/ now followed by [name]  Indolent onset progressively worsening dry Cough and doe x 5 years, Cough is worse lying down or with exercise.  Was able to slow jog at onset, now [facility] = can walk nl pace, flat grade, can't hurry or go uphills or steps s sob , worse in cold air  rec Take prilosec 20 mg Take 30-60 min before first meal of the day every day until you return  GERD  Diet   07/25/2016  f/u ov/Orvetta Danielski re: LIP/ / ILD  Chief Complaint  Patient presents with  . Follow-up    PFT's done today. Breathing is unchanged. She has occ non prod cough and chest tightness.  She uses albuterol inhaler 2-3 x per wk on average.   stopped plaquenil months prior to OV  And reduced the prilosec to qod Can not vacuum even short spurts s sob which is new x 6 months, gradually worse/ never tried saba before exertion No noct symptoms, cough more with exertion in or outside  rec Start symbicort 80 Take 2 puffs first thing in am and then another 2 puffs about 12 hours later.  Work on inhaler technique:  Please remember to go to the  x-ray department downstairs for your tests - we will call you with the results  when they are available.   10/24/2016  f/u ov/Shley Dolby re: LIP/ ILD  Now on symb 80 2bid for AB Chief Complaint  Patient presents with  . Follow-up    Currently on zithromax sinus infection. She states she coughing more and has noticed slight increase in SOB. She has had some green sputum and occ nose bleeds. She rarely uses her albuterol.    albuterol maybe once a month  - no change in ex tol =  MMRC2 = can't walk a nl pace on a flat grade s sob but does fine slow and flat eg shopping but not vacuuming  No obvious day to day or daytime variability or assoc chronic cough or cp or chest tightness, subjective wheeze or overt sinus or hb symptoms. No unusual exp hx or h/o childhood pna/ asthma or knowledge of premature birth.  Sleeping ok without nocturnal  or early am exacerbation  of respiratory  c/o's or need for noct saba. Also denies any obvious fluctuation of symptoms with weather or environmental changes or other aggravating or alleviating factors except as outlined above   Current Medications, Allergies, Complete Past Medical History, Past Surgical History, Family History, and Social History were reviewed in [employer] record.  ROS  The following are  not active complaints unless bolded sore throat, dysphagia, dental problems, itching, sneezing,  nasal congestion or excess/ purulent secretions, ear ache,   fever, chills, sweats, unintended wt loss, classically pleuritic or exertional cp, hemoptysis,  orthopnea pnd or leg swelling, presyncope, palpitations, abdominal pain, anorexia, nausea, vomiting, diarrhea  or change in bowel or bladder habits, change in stools or urine, dysuria,hematuria,  rash, arthralgias, visual complaints, headache, numbness, weakness or ataxia or problems with walking or coordination,  change in mood/affect or memory.                Objective:   Physical Exam   amb wm nad   10/24/2016       173   07/25/2016        168   12/24/15 182 lb 3.2  oz (82.645 kg)  11/09/15 178 lb (80.74 kg)    Vital signs reviewed - Note on arrival 02 sats  98% on RA    HEENT: nl dentition, turbinates, and oropharynx. Nl external ear canals without cough reflex   NECK :  without JVD/Nodes/TM/ nl carotid upstrokes bilaterally   LUNGS: no acc muscle use,  Nl contour chest which is clear to A and P bilaterally without cough on insp or exp maneuvers   CV:  RRR  no s3 or murmur or increase in P2, no edema   ABD:  soft and nontender with nl inspiratory excursion in the supine position. No bruits or organomegaly, bowel sounds nl  MS:  Nl gait/ ext warm without deformities, calf tenderness, cyanosis or clubbing No obvious joint restrictions   SKIN: warm and dry without lesions    NEURO:  alert, approp, nl sensorium with  no motor deficits      I personally reviewed images and agree with radiology impression as follows:  CXR:   07/25/16 1. No acute cardiopulmonary process is identified. 2. Cystic and nodular changes of lung parenchyma are similar to prior chest radiograph and better characterized on prior chest CT.          Assessment & Plan:

## 2016-10-24 NOTE — Patient Instructions (Signed)
No change in meds   Please schedule a follow up visit in 6  months but call sooner if needed with pfts

## 2016-10-25 ENCOUNTER — Encounter: Payer: Self-pay | Admitting: Internal Medicine

## 2016-10-25 NOTE — Assessment & Plan Note (Signed)
FENO 07/25/2016  =   111 -  07/25/2016   try symbicort 80 2bid    - The proper method of use, as well as anticipated side effects, of a metered-dose inhaler are discussed and demonstrated to the patient. Improved effectiveness after extensive coaching during this visit to a level of approximately 75 % from a baseline of 50 % > continue to work on optimal hfa  There is only a minimal asthmatic component here and could consider higher strength ics but this risks irritating the upper airway so instead just rec work on optimal hfa/ f/u with pfts as planned  I had an extended discussion with the patient reviewing all relevant studies completed to date and  lasting 15 to 20 minutes of a 25 minute visit    Each maintenance medication was reviewed in detail including most importantly the difference between maintenance and prns and under what circumstances the prns are to be triggered using an action plan format that is not reflected in the computer generated alphabetically organized AVS.    Please see AVS for unique instructions that I personally wrote and verbalized to the the pt in detail and then reviewed with pt  by my nurse highlighting any  changes in therapy recommended at today's visit to their plan of care.

## 2016-10-25 NOTE — Assessment & Plan Note (Signed)
Onset of Sjogren's in 2000/ maint rx plaquenil / f/u by [facility] > d/c spring 2017 -CT Chest   11/07/15  Multiple thin wall pulmonary cysts of varying sizes with slight mid to lower lung predominance as well as scattered bilateral nodular opacities. 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 Asthma component and she is clearly worse x 6 m but not really clear when she stopped plaquenil > defer rx to Rhe[facility] and we'll focus here on the airway issues (see separate a/p)

## 2017-01-12 ENCOUNTER — Other Ambulatory Visit: Payer: Self-pay | Admitting: Obstetrics & Gynecology

## 2017-01-12 DIAGNOSIS — Z1231 Encounter for screening mammogram for malignant neoplasm of breast: Secondary | ICD-10-CM

## 2017-01-26 ENCOUNTER — Ambulatory Visit
Admission: RE | Admit: 2017-01-26 | Discharge: 2017-01-26 | Disposition: A | Discharge status: Home / Self Care | Payer: BC Managed Care – PPO | Source: Ambulatory Visit | Attending: Obstetrics & Gynecology | Admitting: Obstetrics & Gynecology

## 2017-01-26 DIAGNOSIS — Z1231 Encounter for screening mammogram for malignant neoplasm of breast: Secondary | ICD-10-CM

## 2017-04-24 ENCOUNTER — Other Ambulatory Visit: Payer: Self-pay | Admitting: Internal Medicine

## 2017-04-24 ENCOUNTER — Ambulatory Visit: Payer: BC Managed Care – PPO | Admitting: Internal Medicine

## 2017-04-24 DIAGNOSIS — R0600 Dyspnea, unspecified: Secondary | ICD-10-CM

## 2017-04-27 ENCOUNTER — Ambulatory Visit (INDEPENDENT_AMBULATORY_CARE_PROVIDER_SITE_OTHER): Payer: BC Managed Care – PPO | Admitting: Internal Medicine

## 2017-04-27 ENCOUNTER — Encounter: Payer: Self-pay | Admitting: Internal Medicine

## 2017-04-27 VITALS — BP 142/70 | HR 96 | Ht 63.0 in | Wt 171.0 lb

## 2017-04-27 DIAGNOSIS — J842 Lymphoid interstitial pneumonia: Secondary | ICD-10-CM | POA: Diagnosis not present

## 2017-04-27 DIAGNOSIS — R0600 Dyspnea, unspecified: Secondary | ICD-10-CM

## 2017-04-27 DIAGNOSIS — J45991 Cough variant asthma: Secondary | ICD-10-CM | POA: Diagnosis not present

## 2017-04-27 LAB — PULMONARY FUNCTION TEST
DL/VA % PRED: 77 %
DL/VA: 3.64 ml/min/mmHg/L
DLCO UNC % PRED: 69 %
DLCO cor % pred: 69 %
DLCO cor: 15.84 ml/min/mmHg
DLCO unc: 15.89 ml/min/mmHg
FEF 25-75 Post: 2.45 L/sec
FEF 25-75 Pre: 1.15 L/sec
FEF2575-%CHANGE-POST: 112 %
FEF2575-%PRED-POST: 102 %
FEF2575-%Pred-Pre: 48 %
FEV1-%CHANGE-POST: 21 %
FEV1-%PRED-PRE: 66 %
FEV1-%Pred-Post: 80 %
FEV1-Post: 2.05 L
FEV1-Pre: 1.68 L
FEV1FVC-%CHANGE-POST: 6 %
FEV1FVC-%Pred-Pre: 92 %
FEV6-%Change-Post: 16 %
FEV6-%PRED-POST: 83 %
FEV6-%PRED-PRE: 72 %
FEV6-PRE: 2.27 L
FEV6-Post: 2.64 L
FEV6FVC-%Change-Post: 1 %
FEV6FVC-%PRED-PRE: 101 %
FEV6FVC-%Pred-Post: 103 %
FVC-%CHANGE-POST: 14 %
FVC-%PRED-POST: 81 %
FVC-%Pred-Pre: 70 %
FVC-Post: 2.64 L
FVC-Pre: 2.3 L
POST FEV6/FVC RATIO: 100 %
PRE FEV6/FVC RATIO: 99 %
Post FEV1/FVC ratio: 78 %
Pre FEV1/FVC ratio: 73 %
RV % PRED: 130 %
RV: 2.46 L
TLC % pred: 96 %
TLC: 4.75 L

## 2017-04-27 MED ORDER — BUDESONIDE-FORMOTEROL FUMARATE 160-4.5 MCG/ACT IN AERO: 2.0000 | INHALATION_SPRAY | Freq: Two times a day (BID) | RESPIRATORY_TRACT | 0 refills | Status: DC

## 2017-04-27 NOTE — Patient Instructions (Addendum)
Continue symbicort but change to 160 Take 2 puffs first thing in am and then another 2 puffs about 12 hours later.  And if this makes a difference, call for new Rx otherwise refill the 80  Work on inhaler technique:  relax and gently blow all the way out then take a nice smooth deep breath back in, triggering the inhaler at same time you start breathing in.  Hold for up to 5 seconds if you can. Blow out thru nose. Rinse and gargle with water when done     Only use your albuterol as a rescue medication to be used if you can't catch your breath by resting or doing a relaxed purse lip breathing pattern.  - The less you use it, the better it will work when you need it. - Ok to use up to 2 puffs  every 4 hours if you must but call for immediate appointment if use goes up over your usual need - Don't leave home without it !!  (think of it like the spare tire for your car)    Please schedule a follow up visit in 12  months but call sooner if needed with pfts

## 2017-04-27 NOTE — Assessment & Plan Note (Addendum)
Onset of Sjogren's in 2000/ maint rx plaquenil / f/u by [facility] > d/c spring 2017 -CT Chest   11/07/15  Multiple thin wall pulmonary cysts of varying sizes with slight mid to lower lung predominance as well as scattered bilateral nodular opacities. 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   - PFT's  12/20/15   FEV1 2.03 (79 % ) ratio 76  p 7 % improvement from saba with DLCO  78 % corrects to 85  % for alv volume     Vs prev study 10/19/12 FVC down from 3.17 to 2.62 and FEV1 down from 2.33 to 1.88 (roughly the same %) but dlco no change  PFT's  07/25/2016  FEV1 1.86 (73 % ) ratio 75  p 13 % improvement from saba p nothing prior to study with DLCO  77/78 % corrects to 86 % for alv volume and fvc down to 2.26  - PFT's 04/27/2017  FVC 2.64  (81%)   p saba and DLCO 69% corrects to 88%    Symptoms clearly more related to obstruction than restriction at this point so rx with symbicort and f/u yearly sooner if losing ground

## 2017-04-27 NOTE — Progress Notes (Signed)
PFT done today. 

## 2017-04-27 NOTE — Assessment & Plan Note (Signed)
FENO 07/25/2016  =   111 -  07/25/2016   try symbicort 80 2bid   - PFT's  04/27/2017  FEV1 2.05 (80 % ) ratio 78  p 21 % improvement from saba p nothing prior to study with DLCO  69/69c % corrects to 77  % for alv volume   - 04/27/2017  After extensive coaching HFA effectiveness =  75%     All goals of chronic asthma control met including optimal function and elimination of symptoms with minimal need for rescue therapy though may benefit in terms of ex tol with higher strength so ok to try 160 sample and if no change is symptoms resume the 80 strength   Contingencies discussed in full including contacting this office immediately if not controlling the symptoms using the rule of two's.     I had an extended discussion with the patient reviewing all relevant studies completed to date and  lasting 15 to 20 minutes of a 25 minute visit    Each maintenance medication was reviewed in detail including most importantly the difference between maintenance and prns and under what circumstances the prns are to be triggered using an action plan format that is not reflected in the computer generated alphabetically organized AVS.    Please see AVS for specific instructions unique to this visit that I personally wrote and verbalized to the the pt in detail and then reviewed with pt  by my nurse highlighting any  changes in therapy recommended at today's visit to their plan of care.

## 2017-04-27 NOTE — Progress Notes (Signed)
Subjective:    Patient ID: Donna Fields, female    DOB: [birth year]    MRN: [id number]     Brief patient profile:  2 yowf never smoker prev eval in pulmonary for sob but not in EMR era for doe with neg findings per pt except for obesity/decondtioning   Referred back to pulmonary clinic by Dr [name]  11/09/2015 for progressively worse ex sob/abn cxr c/w sjogren's syndrome with assoc ILD     History of Present Illness  11/09/2015 1st [place] [facility] office visit/ [name]   Chief Complaint  Patient presents with  . Pulmonary Consult    Referred by Dr. [name] for eval of pulmonary nodules. Pt states that she has been SOB "for years"- worse with exertion and exposure to extreme hot or cold temps.   dx'd with Sjogren's around 2000 by Dr Justine Null main symptom was fatigue/ dry mouth rx plaquenil/ now followed by [name]  Indolent onset progressively worsening dry Cough and doe x 5 years, Cough is worse lying down or with exercise.  Was able to slow jog at onset, now [facility] = can walk nl pace, flat grade, can't hurry or go uphills or steps s sob , worse in cold air  rec Take prilosec 20 mg Take 30-60 min before first meal of the day every day until you return  GERD  Diet   07/25/2016  f/u ov/Dwight Burdo re: LIP/ / ILD  Chief Complaint  Patient presents with  . Follow-up    PFT's done today. Breathing is unchanged. She has occ non prod cough and chest tightness.  She uses albuterol inhaler 2-3 x per wk on average.   stopped plaquenil months prior to OV  And reduced the prilosec to qod Can not vacuum even short spurts s sob which is new x 6 months, gradually worse/ never tried saba before exertion No noct symptoms, cough more with exertion in or outside  rec Start symbicort 80 Take 2 puffs first thing in am and then another 2 puffs about 12 hours later.  Work on inhaler technique:   .   10/24/2016  f/u ov/Brightyn Mozer re: LIP/ ILD  Now on symb 80 2bid for AB Chief Complaint  Patient presents  with  . Follow-up    Currently on zithromax sinus infection. She states she coughing more and has noticed slight increase in SOB. She has had some green sputum and occ nose bleeds. She rarely uses her albuterol.    albuterol maybe once a month  - no change in ex tol =  MMRC2 = can't walk a nl pace on a flat grade s sob but does fine slow and flat eg shopping but not vacuuming rec No change in meds  please schedule a follow up visit in 6  months but call sooner if needed with pfts    04/27/2017  f/u ov/Tabrina Esty re:  LIP/ ILD from sjogrens and ? Unrelated ? asthma  Chief Complaint  Patient presents with  . Follow-up    PFT's done today. Breathing is overall doing well. She has minimal non prod cough. She is using albuterol inhaler 2 x per wk on average.   only uses the albuterol with extreme ex Walking 10k steps avg per day at [place] = MMRC1 = can walk nl pace, flat grade, can't hurry or go uphills or steps s sob  On symb 80 2bid   No obvious day to day or daytime variability or assoc excess/ purulent  sputum or mucus plugs or hemoptysis or cp or chest tightness, subjective wheeze or overt sinus or hb symptoms. No unusual exp hx or h/o childhood pna/ asthma or knowledge of premature birth.  Sleeping ok without nocturnal  or early am exacerbation  of respiratory  c/o's or need for noct saba. Also denies any obvious fluctuation of symptoms with weather or environmental changes or other aggravating or alleviating factors except as outlined above   Current Medications, Allergies, Complete Past Medical History, Past Surgical History, Family History, and Social History were reviewed in [employer] record.  ROS  The following are not active complaints unless bolded sore throat, dysphagia, dental problems, itching, sneezing,  nasal congestion or excess/ purulent secretions, ear ache,   fever, chills, sweats, unintended wt loss, classically pleuritic or exertional cp,   orthopnea pnd or leg swelling, presyncope, palpitations, abdominal pain, anorexia, nausea, vomiting, diarrhea  or change in bowel or bladder habits, change in stools or urine, dysuria,hematuria,  rash, arthralgias, visual complaints, headache, numbness, weakness or ataxia or problems with walking or coordination,  change in mood/affect or memory.                       Objective:   Physical Exam   amb wm nad   04/27/2017         171 10/24/2016       173   07/25/2016        168   12/24/15 182 lb 3.2 oz (82.645 kg)  11/09/15 178 lb (80.74 kg)    Vital signs reviewed - Note on arrival 02 sats  97% on RA    HEENT: nl dentition, turbinates, and oropharynx. Nl external ear canals without cough reflex   NECK :  without JVD/Nodes/TM/ nl carotid upstrokes bilaterally   LUNGS: no acc muscle use,  Nl contour chest completely clear to A and P after saba for pft   CV:  RRR  no s3 or murmur or increase in P2, no edema   ABD:  soft and nontender with nl inspiratory excursion in the supine position. No bruits or organomegaly, bowel sounds nl  MS:  Nl gait/ ext warm without deformities, calf tenderness, cyanosis or clubbing No obvious joint restrictions   SKIN: warm and dry without lesions    NEURO:  alert, approp, nl sensorium with  no motor deficits              Assessment & Plan:

## 2017-05-05 ENCOUNTER — Other Ambulatory Visit: Payer: Self-pay | Admitting: Internal Medicine

## 2017-05-05 MED ORDER — BUDESONIDE-FORMOTEROL FUMARATE 160-4.5 MCG/ACT IN AERO: 2.0000 | INHALATION_SPRAY | Freq: Two times a day (BID) | RESPIRATORY_TRACT | 11 refills | Status: DC

## 2017-07-29 ENCOUNTER — Other Ambulatory Visit: Payer: Self-pay | Admitting: Internal Medicine

## 2018-01-21 ENCOUNTER — Other Ambulatory Visit: Payer: Self-pay | Admitting: Internal Medicine

## 2018-01-21 DIAGNOSIS — J849 Interstitial pulmonary disease, unspecified: Secondary | ICD-10-CM

## 2018-01-26 ENCOUNTER — Ambulatory Visit
Admission: RE | Admit: 2018-01-26 | Discharge: 2018-01-26 | Disposition: A | Discharge status: Home / Self Care | Payer: BC Managed Care – PPO | Source: Ambulatory Visit | Attending: Internal Medicine | Admitting: Internal Medicine

## 2018-01-26 DIAGNOSIS — J849 Interstitial pulmonary disease, unspecified: Secondary | ICD-10-CM

## 2018-01-26 MED ORDER — IOPAMIDOL (ISOVUE-300) INJECTION 61%
75.0000 mL | Freq: Once | INTRAVENOUS | Status: AC | PRN
  Administered 2018-01-26: 75 mL via INTRAVENOUS

## 2018-02-02 ENCOUNTER — Encounter: Payer: Self-pay | Admitting: Internal Medicine

## 2018-02-02 ENCOUNTER — Ambulatory Visit: Payer: BC Managed Care – PPO | Admitting: Internal Medicine

## 2018-02-02 VITALS — BP 124/84 | HR 72 | Ht 63.0 in | Wt 181.4 lb

## 2018-02-02 DIAGNOSIS — J45991 Cough variant asthma: Secondary | ICD-10-CM | POA: Diagnosis not present

## 2018-02-02 DIAGNOSIS — J842 Lymphoid interstitial pneumonia: Secondary | ICD-10-CM

## 2018-02-02 MED ORDER — OMEPRAZOLE 20 MG PO CPDR: 20.0000 mg | DELAYED_RELEASE_CAPSULE | Freq: Two times a day (BID) | ORAL | 11 refills | Status: DC

## 2018-02-02 NOTE — Patient Instructions (Addendum)
Increase prilosec to Take 30- 60 min before your first and last meals of the day   GERD (REFLUX)  is an extremely common cause of respiratory symptoms just like yours , many times with no obvious heartburn at all.    It can be treated with medication, but also with lifestyle changes including elevation of the head of your bed (ideally with 6 inch  bed blocks),  Smoking cessation, avoidance of late meals, excessive alcohol, and avoid fatty foods, chocolate, peppermint, colas, red wine, and acidic juices such as orange juice.  NO MINT OR MENTHOL PRODUCTS SO NO COUGH DROPS  USE SUGARLESS CANDY INSTEAD (Jolley ranchers or Stover's or Life Savers) or even ice chips will also do - the key is to swallow to prevent all throat clearing. NO OIL BASED VITAMINS - use powdered substitutes.    I will send a copy to Dr Kathlene November   Please schedule a follow up visit in 3 months but call sooner if needed with pfts

## 2018-02-02 NOTE — Assessment & Plan Note (Addendum)
FENO 07/25/2016  =   111 -  07/25/2016   try symbicort 80 2bid  - PFT's  04/27/2017  FEV1 2.05 (80 % ) ratio 78  p 21 % improvement from saba p nothing prior to study with DLCO  69/69c % corrects to 77  % for alv volume   - 04/27/2017  After extensive coaching HFA effectiveness =  75%     - The proper method of use, as well as anticipated side effects, of a metered-dose inhaler are discussed and demonstrated to the patient.  Could not reproduce her complaint of doe in office so suspect much of this is Upper airway cough syndrome (previously labeled PNDS),  is so named because it's frequently impossible to sort out how much is  CR/sinusitis with freq throat clearing (which can be related to primary GERD)   vs  causing  secondary (" extra esophageal")  GERD from wide swings in gastric pressure that occur with throat clearing, often  promoting self use of mint and menthol lozenges that reduce the lower esophageal sphincter tone and exacerbate the problem further in a cyclical fashion.   These are the same pts (now being labeled as having "irritable larynx syndrome" by some cough centers) who not infrequently have a history of having failed to tolerate ace inhibitors,  dry powder inhalers or biphosphonates or report having atypical/extraesophageal reflux symptoms that don't respond to standard doses of PPI  and are easily confused as having aecopd or asthma flares by even experienced allergists/ pulmonologists (myself included).   rec max rx for gerd/ no change in pulmonary meds for now  I had an extended discussion with the patient reviewing all relevant studies completed to date and  lasting 15 to 20 minutes of a 25 minute acute  visit    Each maintenance medication was reviewed in detail including most importantly the difference between maintenance and prns and under what circumstances the prns are to be triggered using an action plan format that is not reflected in the computer generated alphabetically  organized AVS.    Please see AVS for specific instructions unique to this visit that I personally wrote and verbalized to the the pt in detail and then reviewed with pt  by my nurse highlighting any  changes in therapy recommended at today's visit to their plan of care.

## 2018-02-02 NOTE — Progress Notes (Signed)
Subjective:    Patient ID: Donna Fields, female    DOB: 1961/05/29    MRN: [id number]     Brief patient profile:  59  yowf never smoker prev eval in pulmonary for sob but not in EMR era for doe with neg findings per pt except for obesity/decondtioning   Referred back to pulmonary clinic by Dr [name]  11/09/2015 for progressively worse ex sob/abn cxr c/w sjogren's syndrome with assoc ILD     History of Present Illness  11/09/2015 1st [facility] office visit/ Theo Krumholz   Chief Complaint  Patient presents with  . Pulmonary Consult    Referred by Dr. [name] for eval of pulmonary nodules. Pt states that she has been SOB "for years"- worse with exertion and exposure to extreme hot or cold temps.   dx'd with Sjogren's around 2000 by Dr Justine Null main symptom was fatigue/ dry mouth rx plaquenil/ now followed by [name]  Indolent onset progressively worsening dry Cough and doe x 5 years, Cough is worse lying down or with exercise.  Was able to slow jog at onset, now [facility] = can walk nl pace, flat grade, can't hurry or go uphills or steps s sob , worse in cold air  rec Take prilosec 20 mg Take 30-60 min before first meal of the day every day until you return  GERD  Diet   07/25/2016  f/u ov/Wonda Goodgame re: LIP/ / ILD  Chief Complaint  Patient presents with  . Follow-up    PFT's done today. Breathing is unchanged. She has occ non prod cough and chest tightness.  She uses albuterol inhaler 2-3 x per wk on average.   stopped plaquenil months prior to OV  And reduced the prilosec to qod Can not vacuum even short spurts s sob which is new x 6 months, gradually worse/ never tried saba before exertion No noct symptoms, cough more with exertion in or outside  rec Start symbicort 80 Take 2 puffs first thing in am and then another 2 puffs about 12 hours later.  Work on inhaler technique:   .   10/24/2016  f/u ov/Deylan Canterbury re: LIP/ ILD  Now on symb 80 2bid for AB Chief Complaint  Patient presents  with  . Follow-up    Currently on zithromax sinus infection. She states she coughing more and has noticed slight increase in SOB. She has had some green sputum and occ nose bleeds. She rarely uses her albuterol.    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 Unrelated ? asthma  Chief Complaint  Patient presents with  . Follow-up    PFT's done today. Breathing is overall doing well. She has minimal non prod cough. She is using albuterol inhaler 2 x per wk on average.   only uses the albuterol with extreme ex Walking 10k steps avg per day at [place] = MMRC1 = can walk nl pace, flat grade, can't hurry or go uphills or steps s sob  On symb 80 2bid  rec Continue symbicort but change to 160 Take 2 puffs first thing  in am and then another 2 puffs about 12 hours later.  And if this makes a difference, call for new Rx otherwise refill the 80 Work on inhaler technique:  Only use your albuterol as a rescue medication     02/02/2018   Acute extended  ov/Cornelious Bartolucci re: LIP /  ? cough variant asthma maint on symb 160 2bid  Chief Complaint  Patient presents with  . Acute Visit    She states started elevated BP and increased SOB Jan 2019- cxr was done and then CT Chest. She states she has been getting SOB just walking on a flat surface now when she used to only have issues with incline.    while on symb 160 2bid /prilosec qd  mid Dec 2018 head cold/ coughing /more doe since Dyspnea:   MMRC3 = can't walk 100 yards even at a slow pace at a flat grade s stopping due to sob   Cough: with activity / better at hs non productive  Sleep: ok SABA use:  Maybe twice  weekly   No obvious day to day or daytime variability or  assoc excess/ purulent sputum or mucus plugs or hemoptysis or cp or chest tightness, subjective wheeze or overt sinus or hb symptoms. No unusual exposure hx or h/o childhood pna/ asthma or knowledge of premature birth.  Sleeping ok flat without nocturnal  or early am exacerbation  of respiratory  c/o's or need for noct saba. Also denies any obvious fluctuation of symptoms with weather or environmental changes or other aggravating or alleviating factors except as outlined above   Current Allergies, Complete Past Medical History, Past Surgical History, Family History, and Social History were reviewed in [employer] record.  ROS  The following are not active complaints unless bolded Hoarseness, sore throat, dysphagia, dental problems, itching, sneezing,  nasal congestion or discharge of excess mucus or purulent secretions, ear ache,   fever, chills, sweats, unintended wt loss or wt gain, classically pleuritic or exertional cp,  orthopnea pnd or leg swelling, presyncope, palpitations, abdominal pain, anorexia, nausea, vomiting, diarrhea  or change in bowel habits or change in bladder habits, change in stools or change in urine, dysuria, hematuria,  rash, arthralgias, visual complaints, headache, numbness, weakness or ataxia or problems with walking or coordination,  change in mood/affect or memory.        Current Meds  Medication Sig  . acyclovir (ZOVIRAX) 400 MG tablet Take 400 mg by mouth daily.  Marland Kitchen albuterol (PROAIR HFA) 108 (90 BASE) MCG/ACT inhaler Inhale 2 puffs into the lungs every 6 (six) hours as needed for wheezing or shortness of breath.  . budesonide-formoterol (SYMBICORT) 160-4.5 MCG/ACT inhaler Inhale 2 puffs into the lungs 2 (two) times daily.  . chlorpheniramine (CHLOR-TRIMETON) 4 MG tablet Take 4 mg by mouth daily.  . cholecalciferol (VITAMIN D) 1000 UNITS tablet Take 1,000 Units by mouth daily.  . cyclobenzaprine (FLEXERIL) 10 MG tablet Take 10 mg by mouth daily.    . DULoxetine (CYMBALTA) 60 MG capsule Take 60 mg by mouth daily.  Marland Kitchen ipratropium (ATROVENT) 0.03 % nasal spray Place 2 sprays into the nose 2 (two) times daily.  . meloxicam (MOBIC) 15 MG tablet Take 15 mg by mouth daily.  . nebivolol (BYSTOLIC) 2.5 MG tablet Take 2.5 mg by mouth daily.  .     .   omeprazole (PRILOSEC) 20 MG capsule Take 20 mg by mouth every other day.  Objective:   Physical Exam   amb wf nad    02/02/2018       181  04/27/2017         171 10/24/2016       173   07/25/2016        168   12/24/15 182 lb 3.2 oz (82.645 kg)  11/09/15 178 lb (80.74 kg)    Vital signs reviewed - Note on arrival 02 sats  98% on RA         HEENT: nl dentition, turbinates bilaterally, and oropharynx. Nl external ear canals without cough reflex   NECK :  without JVD/Nodes/TM/ nl carotid upstrokes bilaterally   LUNGS: no acc muscle use,  Nl contour chest with sltly coarsened bs  bilaterally without wheezes or  cough on insp or exp maneuvers   CV:  RRR  no s3 or murmur or increase in P2, and no edema   ABD:  soft and nontender with nl inspiratory excursion in the supine position. No bruits or organomegaly appreciated, bowel sounds nl  MS:  Nl gait/ ext warm without deformities, calf tenderness, cyanosis or clubbing No obvious joint restrictions   SKIN: warm and dry without lesions    NEURO:  alert, approp, nl sensorium with  no motor or cerebellar deficits apparent.       I personally reviewed images and agree with radiology impression as follows:   Chest CT 01/26/18  1. Numerous thin-walled air cysts of various sizes scattered throughout both lungs, some mildly increased in size since 11/07/2015 chest CT. Innumerable irregular pulmonary nodules/nodular opacities in a perilymphatic distribution throughout both lungs, many new or increased and some stable in the interval. 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 Continued high-resolution chest CT follow-up is advised in 12 months. 2. Mild bilateral axillary adenopathy is unchanged, most compatible with benign reactive adenopathy. 3. Stable right adrenal adenoma.      Assessment & Plan:

## 2018-02-08 ENCOUNTER — Encounter: Payer: Self-pay | Admitting: Internal Medicine

## 2018-02-08 NOTE — Assessment & Plan Note (Addendum)
-   PFT's  12/20/15   FEV1 2.03 (79 % ) ratio 76  p 7 % improvement from saba with DLCO  78 % corrects to 85  % for alv volume     Vs prev study 10/19/12 FVC down from 3.17 to 2.62 and FEV1 down from 2.33 to 1.88 (roughly the same %) but dlco no change  PFT's  07/25/2016  FEV1 1.86 (73 % ) ratio 75  p 13 % improvement from saba p nothing prior to study with DLCO  77/78 % corrects to 86 % for alv volume and fvc down to 2.26  - PFT's 04/27/2017  FVC 2.64 (81%)  p saba and DLCO 69% corrects to 88%   - See CT 01/26/18 c/w progression of LIP  -  02/02/2018  Walked RA x 3 laps @ 185 ft each stopped due to  End of study, nl pace, no desat , very minimal sob    Walking study encouraging today though I worry her systemic dz is contributing to LIP and ? Whether needs more aggressive rx > Defer to Rheum  Will have her return for yearly pfts p rx aggressively for gerd in meantime based on uacs (see separate a/p) pattern coughing.

## 2018-05-06 ENCOUNTER — Ambulatory Visit: Payer: BC Managed Care – PPO | Admitting: Internal Medicine

## 2018-05-13 ENCOUNTER — Encounter: Payer: Self-pay | Admitting: Internal Medicine

## 2018-05-13 ENCOUNTER — Ambulatory Visit (INDEPENDENT_AMBULATORY_CARE_PROVIDER_SITE_OTHER): Payer: BC Managed Care – PPO | Admitting: Internal Medicine

## 2018-05-13 ENCOUNTER — Ambulatory Visit: Payer: BC Managed Care – PPO | Admitting: Internal Medicine

## 2018-05-13 VITALS — BP 122/78 | HR 90 | Ht 63.0 in | Wt 182.2 lb

## 2018-05-13 DIAGNOSIS — J45991 Cough variant asthma: Secondary | ICD-10-CM

## 2018-05-13 DIAGNOSIS — J842 Lymphoid interstitial pneumonia: Secondary | ICD-10-CM

## 2018-05-13 LAB — PULMONARY FUNCTION TEST
DL/VA % pred: 81 %
DL/VA: 3.8 ml/min/mmHg/L
DLCO UNC % PRED: 71 %
DLCO UNC: 16.29 ml/min/mmHg
FEF 25-75 POST: 1.35 L/s
FEF 25-75 PRE: 1.18 L/s
FEF2575-%CHANGE-POST: 14 %
FEF2575-%PRED-POST: 57 %
FEF2575-%PRED-PRE: 50 %
FEV1-%CHANGE-POST: 3 %
FEV1-%Pred-Post: 71 %
FEV1-%Pred-Pre: 68 %
FEV1-POST: 1.78 L
FEV1-Pre: 1.72 L
FEV1FVC-%Change-Post: 4 %
FEV1FVC-%PRED-PRE: 93 %
FEV6-%CHANGE-POST: 0 %
FEV6-%PRED-POST: 75 %
FEV6-%Pred-Pre: 75 %
FEV6-POST: 2.34 L
FEV6-Pre: 2.36 L
FEV6FVC-%PRED-POST: 103 %
FEV6FVC-%Pred-Pre: 103 %
FVC-%CHANGE-POST: 0 %
FVC-%PRED-POST: 72 %
FVC-%Pred-Pre: 73 %
FVC-Post: 2.34 L
FVC-Pre: 2.36 L
POST FEV1/FVC RATIO: 76 %
PRE FEV1/FVC RATIO: 73 %
PRE FEV6/FVC RATIO: 100 %
Post FEV6/FVC ratio: 100 %
RV % PRED: 145 %
RV: 2.79 L
TLC % pred: 103 %
TLC: 5.06 L

## 2018-05-13 MED ORDER — BUDESONIDE-FORMOTEROL FUMARATE 160-4.5 MCG/ACT IN AERO: 2.0000 | INHALATION_SPRAY | Freq: Two times a day (BID) | RESPIRATORY_TRACT | 0 refills | Status: DC

## 2018-05-13 NOTE — Patient Instructions (Addendum)
Plan A = symbicort 160 up to 2 puffs every 12 hours if needed   Work on inhaler technique:  relax and gently blow all the way out then take a nice smooth deep breath back in, triggering the inhaler at same time you start breathing in.  Hold for up to 5 seconds if you can. Blow out thru nose. Rinse and gargle with water when done      Plan B = Backup Only use your albuterol as a rescue medication to be used if you can't catch your breath by resting or doing a relaxed purse lip breathing pattern.  - The less you use it, the better it will work when you need it. - Ok to use the inhaler up to 2 puffs  every 4 hours if you must but call for appointment if use goes up over your usual need - Don't leave home without it !!  (think of it like the spare tire for your car)    Please schedule a follow up visit in 12  months but call sooner if needed with PFT's on return

## 2018-05-13 NOTE — Assessment & Plan Note (Signed)
FENO 07/25/2016  =   111 -  07/25/2016   try symbicort 80 2bid  - PFT's  04/27/2017  FEV1 2.05 (80 % ) ratio 78  p 21 % improvement from saba p nothing prior to study with DLCO  69/69c % corrects to 77  % for alv volume   - 05/13/2018  After extensive coaching inhaler device  effectiveness =    75% (too Ti) - PFTs 05/13/2018 min curvature s symbicort prior so rec symb 160 2bid prn     Since she's on systemic steroids and has no significant obstruction off symb this am may be possible to change the symbicort to prn based on Based on two studies from NEJM  378; 20 p 1865 (2018) and 380 : p2020-30 (2019) in pts with mild asthma it is reasonable to use low dose symbicort eg 80 2bid "prn" flare in this setting but I emphasized this was only shown with symbicort and takes advantage of the rapid onset of action but is not the same as "rescue therapy" but can be stopped once the acute symptoms have resolved and the need for rescue has been minimized (< 2 x weekly)     I had an extended discussion with the patient reviewing all relevant studies completed to date and  lasting 15 to 20 minutes of a 25 minute visit    See device teaching which extended face to face time for this visit.  Each maintenance medication was reviewed in detail including emphasizing most importantly the difference between maintenance and prns and under what circumstances the prns are to be triggered using an action plan format that is not reflected in the computer generated alphabetically organized AVS which I have not found useful in most complex patients, especially with respiratory illnesses  Please see AVS for specific instructions unique to this visit that I personally wrote and verbalized to the the pt in detail and then reviewed with pt  by my nurse highlighting any  changes in therapy recommended at today's visit to their plan of care.

## 2018-05-13 NOTE — Assessment & Plan Note (Signed)
Onset of Sjogren's in 2000/ maint rx plaquenil / f/u by [facility] > d/c spring 2017 -CT Chest   11/07/15  Multiple thin wall pulmonary cysts of varying sizes with slight mid to lower lung predominance as well as scattered bilateral nodular opacities. 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   - PFT's  12/20/15   FEV1 2.03 (79 % ) ratio 76  p 7 % improvement from saba with DLCO  78 % corrects to 85  % for alv volume     Vs prev study 10/19/12 FVC down from 3.17 to 2.62 and FEV1 down from 2.33 to 1.88 (roughly the same %) but dlco no change  PFT's  07/25/2016  FEV1 1.86 (73 % ) ratio 75  p 13 % improvement from saba p nothing prior to study with DLCO  77/78 % corrects to 86 % for alv volume and fvc down to 2.26  - PFT's 04/27/2017  FVC 2.64 (81%)  p saba and DLCO 69% corrects to 88%  - See CT 01/26/18 c/w progression of LIP  -  02/02/2018  Walked RA x 3 laps @ 185 ft each stopped due to  End of study, nl pace, no desat, very minimal sob     - PFT's  FVC  2.36 (73%) and no obst off symb x 12h  and dlco 71% and corrects to 81%    No evidence of dz progression > f/u per rheum and return here yearly for pfts  In meantime strongly rec more consistent daily ex routine to monitor dz progression and help rheum adjust meds

## 2018-05-13 NOTE — Progress Notes (Signed)
PFT completed 05/13/18  

## 2018-05-13 NOTE — Progress Notes (Signed)
Subjective:    Patient ID: Donna Fields, female    DOB: 1961/05/29    MRN: [id number]     Brief patient profile:  59  yowf never smoker prev eval in pulmonary for sob but not in EMR era for doe with neg findings per pt except for obesity/decondtioning   Referred back to pulmonary clinic by Dr [name]  11/09/2015 for progressively worse ex sob/abn cxr c/w sjogren's syndrome with assoc ILD     History of Present Illness  11/09/2015 1st [facility] office visit/ Donna Fields   Chief Complaint  Patient presents with  . Pulmonary Consult    Referred by Dr. [name] for eval of pulmonary nodules. Pt states that she has been SOB "for years"- worse with exertion and exposure to extreme hot or cold temps.   dx'd with Sjogren's around 2000 by Dr Justine Null main symptom was fatigue/ dry mouth rx plaquenil/ now followed by [name]  Indolent onset progressively worsening dry Cough and doe x 5 years, Cough is worse lying down or with exercise.  Was able to slow jog at onset, now [facility] = can walk nl pace, flat grade, can't hurry or go uphills or steps s sob , worse in cold air  rec Take prilosec 20 mg Take 30-60 min before first meal of the day every day until you return  GERD  Diet   07/25/2016  f/u ov/Donna Fields re: LIP/ / ILD  Chief Complaint  Patient presents with  . Follow-up    PFT's done today. Breathing is unchanged. She has occ non prod cough and chest tightness.  She uses albuterol inhaler 2-3 x per wk on average.   stopped plaquenil months prior to OV  And reduced the prilosec to qod Can not vacuum even short spurts s sob which is new x 6 months, gradually worse/ never tried saba before exertion No noct symptoms, cough more with exertion in or outside  rec Start symbicort 80 Take 2 puffs first thing in am and then another 2 puffs about 12 hours later.  Work on inhaler technique:   .   10/24/2016  f/u ov/Donna Fields re: LIP/ ILD  Now on symb 80 2bid for AB Chief Complaint  Patient presents  with  . Follow-up    Currently on zithromax sinus infection. She states she coughing more and has noticed slight increase in SOB. She has had some green sputum and occ nose bleeds. She rarely uses her albuterol.    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 Unrelated ? asthma  Chief Complaint  Patient presents with  . Follow-up    PFT's done today. Breathing is overall doing well. She has minimal non prod cough. She is using albuterol inhaler 2 x per wk on average.   only uses the albuterol with extreme ex Walking 10k steps avg per day at [place] = MMRC1 = can walk nl pace, flat grade, can't hurry or go uphills or steps s sob  On symb 80 2bid  rec Continue symbicort but change to 160 Take 2 puffs first thing  in am and then another 2 puffs about 12 hours later.  And if this makes a difference, call for new Rx otherwise refill the 80 Work on inhaler technique:  Only use your albuterol as a rescue medication     02/02/2018   Acute extended  ov/Donna Fields re: LIP /  ? cough variant asthma maint on symb 160 2bid  Chief Complaint  Patient presents with  . Acute Visit    She states started elevated BP and increased SOB Jan 2019- cxr was done and then CT Chest. She states she has been getting SOB just walking on a flat surface now when she used to only have issues with incline.    while on symb 160 2bid /prilosec qd  mid Dec 2018 head cold/ coughing /more doe since Dyspnea:   MMRC3 = can't walk 100 yards even at a slow pace at a flat grade s stopping due to sob   Cough: with activity / better at hs non productive  Sleep: ok SABA use:  Maybe twice  weekly    05/13/2018  f/u ov/Donna Fields re: LIP/ ? Cough variant  asthma  On pred x "2 pills daily" per rheum /imuran Chief Complaint  Patient presents with  . Follow-up    follow up for ILD. patient states that breathing has gotten better with medications. patient uses rescue inhaler aboout once a week.   Dyspnea better since rx with pred but still :  Vacuming/ walking up inclines = MMRC1 = can walk nl pace, flat grade, can't hurry or go uphills or steps s sob   Cough: with activity/ not hs     SABA use:  Once a week at most   sats with ex as low 90's / no routine   No obvious day to day or daytime variability or assoc excess/ purulent sputum or mucus plugs or hemoptysis or cp or chest tightness, subjective wheeze or overt sinus or hb symptoms. No unusual exposure hx or h/o childhood pna/ asthma or knowledge of premature birth.  Sleeping  Fine on one pillow  without nocturnal  or early am exacerbation  of respiratory  c/o's or need for noct saba. Also denies any obvious fluctuation of symptoms with weather or environmental changes or other aggravating or alleviating factors except as outlined above   Current Allergies, Complete Past Medical History, Past Surgical History, Family History, and Social History were reviewed in [employer] record.  ROS  The following are not active complaints unless bolded Hoarseness, sore throat, dysphagia, dental problems, itching, sneezing,  nasal congestion or discharge of excess mucus or purulent secretions, ear ache,   fever, chills, sweats, unintended wt loss or wt gain, classically pleuritic or exertional cp,  orthopnea pnd or arm/hand swelling  or leg swelling, presyncope, palpitations, abdominal pain, anorexia, nausea, vomiting, diarrhea  or change in bowel habits or change in bladder habits, change in stools or change in urine, dysuria, hematuria,  rash, arthralgias, visual complaints, headache, numbness, weakness or ataxia or problems with walking or coordination,  change in mood or  memory.          Current Meds  Medication Sig  . acyclovir (ZOVIRAX) 400 MG tablet Take 400 mg by mouth daily.  Marland Kitchen albuterol (PROAIR HFA) 108 (90 BASE) MCG/ACT inhaler Inhale 2 puffs into the lungs every 6 (six) hours as needed for wheezing or shortness of breath.  . azaTHIOprine (IMURAN) 50 MG tablet Take 50 mg by mouth 3 (three) times daily.  Marland Kitchen  budesonide-formoterol (SYMBICORT) 160-4.5 MCG/ACT inhaler Inhale 2 puffs into the lungs 2 (two) times daily.  . chlorpheniramine (CHLOR-TRIMETON) 4 MG tablet Take 4 mg by mouth daily.  . cholecalciferol (VITAMIN D) 1000 UNITS tablet Take 1,000 Units by mouth daily.  . cyclobenzaprine (FLEXERIL) 10 MG tablet Take 10 mg by mouth daily.  . DULoxetine (CYMBALTA) 60 MG capsule Take 60 mg by mouth daily.  Marland Kitchen ipratropium (ATROVENT) 0.03 % nasal spray Place 2 sprays into the nose 2 (two) times daily.  . meloxicam (MOBIC) 15 MG tablet Take 15 mg by mouth daily.  . nebivolol (BYSTOLIC) 2.5 MG tablet Take 2.5 mg by mouth daily.  Marland Kitchen omeprazole (PRILOSEC) 20 MG capsule Take 1 capsule (20 mg total) by mouth 2 (two) times daily before a meal.  . predniSONE (DELTASONE) 10 MG tablet Take 10 mg by mouth 2 (two) times daily.  . [DISCONTINUED] budesonide-formoterol (SYMBICORT) 160-4.5 MCG/ACT inhaler Inhale 2 puffs into the lungs 2 (two) times daily.               Objective:   Physical Exam   amb pleasant wf nad   05/13/2018       182  02/02/2018       181  04/27/2017         171 10/24/2016       173   07/25/2016        168   12/24/15 182 lb 3.2 oz (82.645 kg)  11/09/15 178 lb (80.74 kg)    Vital signs reviewed - Note on arrival 02 sats  96% on RA       HEENT: nl dentition, turbinates bilaterally, and oropharynx. Nl external ear canals without cough reflex   NECK :  without JVD/Nodes/TM/ nl carotid upstrokes bilaterally   LUNGS: no acc muscle use,  Nl contour chest which is clear to A and P bilaterally without cough on insp or exp maneuvers   CV:  RRR  no s3 or murmur  or increase in P2, and no edema   ABD:  soft and nontender with nl inspiratory excursion in the supine position. No bruits or organomegaly appreciated, bowel sounds nl  MS:  Nl gait/ ext warm without deformities, calf tenderness, cyanosis or clubbing No obvious joint restrictions   SKIN: warm and dry without lesions    NEURO:  alert, approp, nl sensorium with  no motor or cerebellar deficits apparent.            Assessment & Plan:

## 2018-07-30 ENCOUNTER — Other Ambulatory Visit: Payer: Self-pay | Admitting: Internal Medicine

## 2018-07-30 ENCOUNTER — Other Ambulatory Visit: Payer: Self-pay | Admitting: Obstetrics & Gynecology

## 2018-07-30 DIAGNOSIS — Z1231 Encounter for screening mammogram for malignant neoplasm of breast: Secondary | ICD-10-CM

## 2018-08-05 ENCOUNTER — Ambulatory Visit
Admission: RE | Admit: 2018-08-05 | Discharge: 2018-08-05 | Disposition: A | Discharge status: Home / Self Care | Payer: BC Managed Care – PPO | Source: Ambulatory Visit | Attending: Internal Medicine | Admitting: Internal Medicine

## 2018-08-05 DIAGNOSIS — Z1231 Encounter for screening mammogram for malignant neoplasm of breast: Secondary | ICD-10-CM

## 2019-03-27 ENCOUNTER — Other Ambulatory Visit: Payer: Self-pay | Admitting: Internal Medicine

## 2019-05-05 ENCOUNTER — Other Ambulatory Visit: Payer: Self-pay | Admitting: *Deleted

## 2019-05-13 ENCOUNTER — Other Ambulatory Visit: Payer: Self-pay | Admitting: Internal Medicine

## 2019-05-13 DIAGNOSIS — J45991 Cough variant asthma: Secondary | ICD-10-CM

## 2019-05-14 ENCOUNTER — Other Ambulatory Visit (HOSPITAL_COMMUNITY)
Admission: RE | Admit: 2019-05-14 | Discharge: 2019-05-14 | Disposition: A | Discharge status: Home / Self Care | Payer: BC Managed Care – PPO | Source: Ambulatory Visit | Attending: Internal Medicine | Admitting: Internal Medicine

## 2019-05-14 DIAGNOSIS — Z1159 Encounter for screening for other viral diseases: Secondary | ICD-10-CM | POA: Diagnosis not present

## 2019-05-15 LAB — SARS CORONAVIRUS 2 (TAT 6-24 HRS): SARS Coronavirus 2: NEGATIVE

## 2019-05-16 ENCOUNTER — Ambulatory Visit (INDEPENDENT_AMBULATORY_CARE_PROVIDER_SITE_OTHER): Payer: BC Managed Care – PPO | Admitting: Internal Medicine

## 2019-05-16 ENCOUNTER — Other Ambulatory Visit: Payer: Self-pay

## 2019-05-16 ENCOUNTER — Encounter: Payer: Self-pay | Admitting: Pulmonary Disease

## 2019-05-16 ENCOUNTER — Ambulatory Visit: Payer: BC Managed Care – PPO | Admitting: Internal Medicine

## 2019-05-16 ENCOUNTER — Ambulatory Visit: Payer: BC Managed Care – PPO | Admitting: Pulmonary Disease

## 2019-05-16 VITALS — BP 124/86 | HR 78 | Temp 98.5°F | Ht 62.25 in | Wt 180.6 lb

## 2019-05-16 DIAGNOSIS — J45991 Cough variant asthma: Secondary | ICD-10-CM

## 2019-05-16 DIAGNOSIS — J842 Lymphoid interstitial pneumonia: Secondary | ICD-10-CM | POA: Diagnosis not present

## 2019-05-16 DIAGNOSIS — J849 Interstitial pulmonary disease, unspecified: Secondary | ICD-10-CM

## 2019-05-16 LAB — PULMONARY FUNCTION TEST
DL/VA % pred: 105 %
DL/VA: 4.5 ml/min/mmHg/L
DLCO unc % pred: 87 %
DLCO unc: 16.78 ml/min/mmHg
FEF 25-75 Post: 1.59 L/sec
FEF 25-75 Pre: 1 L/sec
FEF2575-%Change-Post: 58 %
FEF2575-%Pred-Post: 69 %
FEF2575-%Pred-Pre: 43 %
FEV1-%Change-Post: 13 %
FEV1-%Pred-Post: 67 %
FEV1-%Pred-Pre: 59 %
FEV1-Post: 1.62 L
FEV1-Pre: 1.43 L
FEV1FVC-%Change-Post: 5 %
FEV1FVC-%Pred-Pre: 94 %
FEV6-%Change-Post: 7 %
FEV6-%Pred-Post: 69 %
FEV6-%Pred-Pre: 64 %
FEV6-Post: 2.08 L
FEV6-Pre: 1.93 L
FEV6FVC-%Pred-Post: 103 %
FEV6FVC-%Pred-Pre: 103 %
FVC-%Change-Post: 7 %
FVC-%Pred-Post: 66 %
FVC-%Pred-Pre: 61 %
FVC-Post: 2.08 L
FVC-Pre: 1.93 L
Post FEV1/FVC ratio: 78 %
Post FEV6/FVC ratio: 100 %
Pre FEV1/FVC ratio: 74 %
Pre FEV6/FVC Ratio: 100 %
RV % pred: 159 %
RV: 3 L
TLC % pred: 107 %
TLC: 5.15 L

## 2019-05-16 NOTE — Progress Notes (Signed)
Full PFT performed today. °

## 2019-05-16 NOTE — Patient Instructions (Addendum)
Walk today   High Res CT Chest ordered to be completed   Continue Symbicort 160 >>> 2 puffs in the morning right when you wake up, rinse out your mouth after use, 12 hours later 2 puffs, rinse after use >>> Take this daily, no matter what >>> This is not a rescue inhaler   Only use your albuterol as a rescue medication to be used if you can't catch your breath by resting or doing a relaxed purse lip breathing pattern.  - The less you use it, the better it will work when you need it. - Ok to use up to 2 puffs  every 4 hours if you must but call for immediate appointment if use goes up over your usual need - Don't leave home without it !!  (think of it like the spare tire for your car)   Keep follow up with Rheumatology next week      Return in about 1 year (around 05/15/2020), or if symptoms worsen or fail to improve, for After Chest CT, Follow up for PFT, Follow up with Dr. Melvyn Novas.      Coronavirus (COVID-19) Are you at risk?  Are you at risk for the Coronavirus (COVID-19)?  To be considered HIGH RISK for Coronavirus (COVID-19), you have to meet the following criteria:  . Traveled to [place], [place], [place], [place] or [place]; or in the [place] to [place], [place], [place], or [place]; and have fever, cough, and shortness of breath within the last 2 weeks of travel OR . Been in close contact with a person diagnosed with COVID-19 within the last 2 weeks and have fever, cough, and shortness of breath . IF YOU DO NOT MEET THESE CRITERIA, YOU ARE CONSIDERED LOW RISK FOR COVID-19.  What to do if you are HIGH RISK for COVID-19?  Marland Kitchen If you are having a medical emergency, call 911. . Seek medical care right away. Before you go to a doctor's office, urgent care or emergency department, call ahead and tell them about your recent travel, contact with someone diagnosed with COVID-19, and your symptoms. You should receive instructions from your physician's office regarding next  steps of care.  . When you arrive at healthcare provider, tell the healthcare staff immediately you have returned from visiting [place], [place], [place], [place] or [place]; or traveled in the [place] to [place], [place], [place], or [place]; in the last two weeks or you have been in close contact with a person diagnosed with COVID-19 in the last 2 weeks.   . Tell the health care staff about your symptoms: fever, cough and shortness of breath. . After you have been seen by a medical provider, you will be either: o Tested for (COVID-19) and discharged home on quarantine except to seek medical care if symptoms worsen, and asked to  - Stay home and avoid contact with others until you get your results (4-5 days)  - Avoid travel on public transportation if possible (such as bus, train, or airplane) or o Sent to the Emergency Department by EMS for evaluation, COVID-19 testing, and possible admission depending on your condition and test results.  What to do if you are LOW RISK for COVID-19?  Reduce your risk of any infection by using the same precautions used for avoiding the common cold or flu:  Marland Kitchen Wash your hands often with soap and warm water for at least 20 seconds.  If soap and water are not readily  available, use an alcohol-based hand sanitizer with at least 60% alcohol.  . If coughing or sneezing, cover your mouth and nose by coughing or sneezing into the elbow areas of your shirt or coat, into a tissue or into your sleeve (not your hands). . Avoid shaking hands with others and consider head nods or verbal greetings only. . Avoid touching your eyes, nose, or mouth with unwashed hands.  . Avoid close contact with people who are sick. . Avoid places or events with large numbers of people in one location, like concerts or sporting events. . Carefully consider travel plans you have or are making. . If you are planning any travel outside or inside the [place], visit the CDC's Travelers' Health  webpage for the latest health notices. . If you have some symptoms but not all symptoms, continue to monitor at home and seek medical attention if your symptoms worsen. . If you are having a medical emergency, call 911.   [place] / e-Visit: [email or web address]         [facility]: [facility]: [phone]                   [facility]: [phone]           It is flu season:   >>> Best ways to protect herself from the flu: Receive the yearly flu vaccine, practice good hand hygiene washing with soap and also using hand sanitizer when available, eat a nutritious meals, get adequate rest, hydrate appropriately   Please contact the office if your symptoms worsen or you have concerns that you are not improving.   Thank you for choosing [facility] for your healthcare, and for allowing [place] to partner with you on your healthcare journey. I am thankful to be able to provide care to you today.   Wyn Quaker FNP-C

## 2019-05-16 NOTE — Assessment & Plan Note (Addendum)
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

## 2019-05-16 NOTE — Assessment & Plan Note (Signed)
Plan: Continue Symbicort 160 Follow-up with our office in 1 year

## 2019-05-16 NOTE — Progress Notes (Signed)
@Patient  ID: Donna Fields, female    DOB: October 11, 1961, 58 y.o.   MRN: [id number]  Chief Complaint  Patient presents with  . 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  Patient is maintained on Symbicort 160.  Patient is followed by rheumatology for Sjogren's.  Patient is managed on Imuran.  Patient follows with pulmonary annually with pulmonary function testing.  Pulmonary function test completed today:  05/16/2019- pulmonary function test- FVC 1.93 (61% predicted), postbronchodilator ratio 78, postbronchodilator FEV1 1.62 (67% predicted), improvement after bronchodilator and FEV1 as well as mid flow, DLCO 87  Patient still works the [employer].  She has been working telephonically since March/2020 due to the COVID-19 pandemic.  Patient reports that when she was at work she was using her rescue inhaler more often around 3 times daily.  Since she has been working telephonically at home she has not needed to use her rescue inhaler. Work at the [employer] - Breathlessness Score 2 - on level ground, I walk slower than people of the same age because of breathlessness, or have to stop for breathe when walking to my own pace      Tests:   - PFT's  12/20/15   FEV1 2.03 (79 % ) ratio 76  p 7 % improvement from saba with DLCO  78 % corrects to 85  % for alv volume     Vs prev study 10/19/12 FVC down from 3.17 to 2.62 and FEV1 down from 2.33 to 1.88 (roughly the same %) but dlco no change  PFT's  07/25/2016  FEV1 1.86 (73 % ) ratio 75  p 13 % improvement from saba p nothing prior to study with DLCO  77/78 % corrects to 86 % for alv volume and fvc  down to 2.26  - PFT's 04/27/2017  FVC 2.64 (81%)  p saba and DLCO 69% corrects to 88%  - See CT 01/26/18 c/w progression of LIP  -  02/02/2018  Walked RA x 3 laps @ 185 ft each stopped due to  End of study, nl pace, no desat, very minimal sob     - PFT's  FVC  2.36 (73%) and no obst off symb x 12h  and dlco 71% and corrects to 81%   05/16/2019- pulmonary function test- FVC 1.93 (61% predicted), postbronchodilator ratio 78, postbronchodilator FEV1 1.62 (67% predicted), improvement after bronchodilator and FEV1 as well as mid flow, DLCO 87  01/26/2018-CT chest with contrast-numerous thin-walled air cysts of various sizes scattered throughout both lungs some mildly increased in size since 11/07/2015 CT chest, innumerable irregular pulmonary nodules/nodular opacities in the perilymphatic distribution throughout both lungs many new or increase in some stable and interval coming these findings are most compatible with progression of ILD and the lymphoid interstitial pneumonia (LIP) pattern, compatible with patient's reported history of Sjogren syndrome, continued high-resolution CT chest follow-up is advised in 12 months, mild bilateral axillary adenopathy is unchanged, stable right adrenal adenoma  FENO:  Lab Results  Component Value Date   NITRICOXIDE 111 07/25/2016    PFT: PFT Results Latest Ref Rng &  Units 05/16/2019 05/13/2018 04/27/2017 07/25/2016 12/20/2015  FVC-Pre L 1.93 2.36 2.30 - 2.62  FVC-Predicted Pre % 61 73 70 69 79  FVC-Post L 2.08 2.34 2.64 2.47 2.67  FVC-Predicted Post % 66 72 81 75 81  Pre FEV1/FVC % % 74 73 73 72 72  Post FEV1/FCV % % 78 76 78 75 76  FEV1-Pre L 1.43 1.72 1.68 1.63 1.88  FEV1-Predicted Pre % 59 68 66 64 73  FEV1-Post L 1.62 1.78 2.05 1.86 2.03  DLCO UNC% % 87 71 69 77 78  DLCO COR %Predicted % 105 81 77 86 85  TLC L 5.15 5.06 4.75 4.82 5.17  TLC % Predicted % 107 103 96 98 105  RV % Predicted % 159 145 130 134 137    Imaging: No results found.    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   - PFT's  12/20/15   FEV1 2.03 (79 % ) ratio 76  p 7 % improvement from saba with DLCO  78 % corrects to 85  % for alv volume     Vs prev study 10/19/12 FVC down from 3.17 to 2.62 and FEV1 down from 2.33 to 1.88 (roughly the same %) but dlco no change  PFT's  07/25/2016  FEV1 1.86 (73 % ) ratio 75  p 13 % improvement from saba p nothing prior to study with DLCO  77/78 % corrects to 86 % for alv volume and fvc down to 2.26  - PFT's 04/27/2017  FVC 2.64 (81%)  p saba and DLCO 69% corrects to 88%  - See CT 01/26/18 c/w progression of LIP  -  02/02/2018  Walked RA x 3 laps @ 185 ft each stopped due to  End of study, nl pace, no desat, very minimal sob     - PFT's  FVC  2.36 (73%) and no obst off symb x 12h  and dlco 71% and corrects to 81%    HRCT ordered >>>      Cough variant asthma    FENO 07/25/2016  =   111 -  07/25/2016   try symbicort 80 2bid  - PFT's  04/27/2017  FEV1 2.05 (80 % ) ratio 78  p 21 % improvement from saba p nothing prior to study with DLCO  69/69c % corrects to 77  % for alv volume   - 05/13/2018  After extensive coaching inhaler device  effectiveness =    75% (too Ti) - PFTs 05/13/2018 min curvature s symbicort prior so rec symb 160 2bid prn           No Known Allergies  Immunization History  Administered Date(s) Administered  . Influenza Split 08/25/2015  . Influenza-Unspecified 09/08/2017    Past Medical History:  Diagnosis Date  . Sjogren's syndrome ([place])     Tobacco History: Social History   Tobacco Use  Smoking Status Never Smoker  Smokeless  Tobacco Never Used   Counseling given: Not Answered  Continue to not smoke  Outpatient Encounter Medications as of 05/16/2019  Medication Sig  . acyclovir (ZOVIRAX) 400 MG tablet  Take 400 mg by mouth daily.  Marland Kitchen albuterol (PROAIR HFA) 108 (90 BASE) MCG/ACT inhaler Inhale 2 puffs into the lungs every 6 (six) hours as needed for wheezing or shortness of breath.  . azaTHIOprine (IMURAN) 50 MG tablet Take 50 mg by mouth 3 (three) times daily.  . budesonide-formoterol (SYMBICORT) 160-4.5 MCG/ACT inhaler Inhale 2 puffs into the lungs 2 (two) times daily.  . chlorpheniramine (CHLOR-TRIMETON) 4 MG tablet Take 4 mg by mouth daily.  . cholecalciferol (VITAMIN D) 1000 UNITS tablet Take 1,000 Units by mouth daily.  . cyclobenzaprine (FLEXERIL) 10 MG tablet Take 10 mg by mouth daily.  . DULoxetine (CYMBALTA) 60 MG capsule Take 60 mg by mouth daily.  Marland Kitchen ipratropium (ATROVENT) 0.03 % nasal spray Place 2 sprays into the nose 2 (two) times daily.  . meloxicam (MOBIC) 15 MG tablet Take 15 mg by mouth daily.  . nebivolol (BYSTOLIC) 2.5 MG tablet Take 2.5 mg by mouth daily.  Marland Kitchen omeprazole (PRILOSEC) 20 MG capsule TAKE 1 CAPSULE (20 MG TOTAL) BY MOUTH 2 (TWO) TIMES DAILY BEFORE A MEAL.  . predniSONE (DELTASONE) 10 MG tablet Take 10 mg by mouth 2 (two) times daily.   No facility-administered encounter medications on file as of 05/16/2019.      Review of Systems  Review of Systems  Constitutional: Positive for fatigue. Negative for chills, fever and unexpected weight change.  HENT: Negative for congestion, sinus pressure and sinus pain.   Respiratory: Positive for shortness of breath. Negative for cough, chest tightness and wheezing.   Cardiovascular: Negative for chest pain and palpitations.  Gastrointestinal: Negative for diarrhea, nausea and vomiting.  Musculoskeletal: Negative for arthralgias.  Skin: Negative for color change.  Allergic/Immunologic: Negative for environmental allergies and food allergies.   Neurological: Negative for dizziness, light-headedness and headaches.  Psychiatric/Behavioral: Negative for dysphoric mood. The patient is not nervous/anxious.   All other systems reviewed and are negative.    Physical Exam  BP 124/86 (BP Location: Left Leg, Patient Position: Sitting, Cuff Size: Normal)   Pulse 78   Temp 98.5 F (36.9 C)   Ht 5' 2.25" (1.581 m)   Wt 180 lb 9.6 oz (81.9 kg)   SpO2 96%   BMI 32.77 kg/m   Wt Readings from Last 5 Encounters:  05/16/19 180 lb 9.6 oz (81.9 kg)  05/13/18 182 lb 3.2 oz (82.6 kg)  02/02/18 181 lb 6.4 oz (82.3 kg)  04/27/17 171 lb (77.6 kg)  10/24/16 173 lb 6.4 oz (78.7 kg)     Physical Exam  Constitutional: She is oriented to person, place, and time and well-developed, well-nourished, and in no distress. No distress.  HENT:  Head: Normocephalic and atraumatic.  Right Ear: Hearing, tympanic membrane, external ear and ear canal normal.  Left Ear: Hearing, tympanic membrane, external ear and ear canal normal.  Nose: Nose normal.  Mouth/Throat: Uvula is midline and oropharynx is clear and moist. No oropharyngeal exudate.  Eyes: Pupils are equal, round, and reactive to light.  Neck: Normal range of motion. Neck supple.  Cardiovascular: Normal rate, regular rhythm and normal heart sounds.  Pulmonary/Chest: Effort normal and breath sounds normal. No accessory muscle usage. No respiratory distress. She has no decreased breath sounds. She has no wheezes. She has no rhonchi. She has no rales.  Abdominal: Soft. Bowel sounds are normal. She exhibits no distension. There is no abdominal tenderness.  Musculoskeletal: Normal range of motion.        General: No edema.  Lymphadenopathy:  She has no cervical adenopathy.  Neurological: She is alert and oriented to person, place, and time. Gait normal.  Skin: Skin is warm and dry. She is not diaphoretic. No erythema.  Psychiatric: Mood, memory, affect and judgment normal.  Nursing note and  vitals reviewed.     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 Melvyn Novas.   Lauraine Rinne, NP 05/16/2019   This appointment was 26 minutes long with over 50% of the time in direct face-to-face patient care, assessment, plan of care, and follow-up.

## 2019-05-26 ENCOUNTER — Telehealth: Payer: Self-pay | Admitting: Pulmonary Disease

## 2019-05-26 NOTE — Telephone Encounter (Signed)
LMAM for Ena Dawley this was authorized on 05/16/19 it looks like.

## 2019-05-26 NOTE — Telephone Encounter (Signed)
Ill route to [facility] pool for follow up

## 2019-05-31 ENCOUNTER — Ambulatory Visit
Admission: RE | Admit: 2019-05-31 | Discharge: 2019-05-31 | Disposition: A | Discharge status: Home / Self Care | Payer: BC Managed Care – PPO | Source: Ambulatory Visit | Attending: Pulmonary Disease | Admitting: Pulmonary Disease

## 2019-05-31 DIAGNOSIS — J842 Lymphoid interstitial pneumonia: Secondary | ICD-10-CM

## 2019-05-31 DIAGNOSIS — J849 Interstitial pulmonary disease, unspecified: Secondary | ICD-10-CM

## 2019-06-01 ENCOUNTER — Telehealth: Payer: Self-pay | Admitting: Pulmonary Disease

## 2019-06-01 NOTE — Telephone Encounter (Signed)
Notes recorded by Lauraine Rinne, NP on 06/01/2019 at 8:37 AM EDT  CT chest results have come back. There is been some progression with the lymphoid interstitial pneumonia related to your Sjogren's syndrome. This also follows a similar pattern of the slightly worsened restriction on your pulmonary function test that you completed in June/2020. Keep same follow-up with our office. We will repeat a pulmonary function test in 1 year. If you start to have worsening shortness of breath or breathing please contact our office as well as rheumatology.   Please route these results to rheumatology.   I will also route these results to Dr. Melvyn Novas as an Juluis Rainier.   Wyn Quaker, FNP ---------------------------------------------------------- Spoke with pt. She is aware of results. Results have been routed to pt's Rheumatologist. Nothing further was needed.

## 2019-06-01 NOTE — Progress Notes (Signed)
CT chest results have come back.  There is been some progression with the lymphoid interstitial pneumonia related to your Sjogren's syndrome.  This also follows a similar pattern of the slightly worsened restriction on your pulmonary function test that you completed in June/2020.  Keep same follow-up with our office.  We will repeat a pulmonary function test in 1 year.  If you start to have worsening shortness of breath or breathing please contact our office as well as rheumatology.  Please route these results to rheumatology.   I will also route these results to Dr. Melvyn Novas as an Juluis Rainier.  Wyn Quaker, FNP

## 2019-06-06 NOTE — Progress Notes (Signed)
Chart and office note reviewed in detail  > agree with a/p as outlined    

## 2019-07-01 ENCOUNTER — Other Ambulatory Visit: Payer: Self-pay | Admitting: Internal Medicine

## 2020-03-08 ENCOUNTER — Telehealth: Payer: Self-pay | Admitting: Internal Medicine

## 2020-03-08 DIAGNOSIS — J849 Interstitial pulmonary disease, unspecified: Secondary | ICD-10-CM

## 2020-03-08 NOTE — Telephone Encounter (Signed)
Needs hrct and pfts/ov same day to help Dr Buck Mam decide how to treat her as the imuran looks like it no longer is controlling the problem.   Let her know Dr Buck Mam called to ask me to order these for him and go over the results with her asap ideally before her next visit with him

## 2020-03-08 NOTE — Telephone Encounter (Signed)
Routing encounter to Dr. Melvyn Novas.

## 2020-03-08 NOTE — Telephone Encounter (Signed)
LMTCB x1 for pt.  

## 2020-03-09 NOTE — Telephone Encounter (Signed)
ATC pt, no answer. Left message for pt to call back.  

## 2020-03-13 NOTE — Telephone Encounter (Signed)
LMTCB x 3 and will send letter if she does not call back after next attempt

## 2020-03-13 NOTE — Telephone Encounter (Signed)
Spoke with the pt  She is already scheduled for PFT and ov with Aaron Edelman  I advised needs HRCT- ord  Nothing further needed

## 2020-03-14 ENCOUNTER — Ambulatory Visit: Payer: BC Managed Care – PPO | Admitting: Pulmonary Disease

## 2020-03-23 ENCOUNTER — Other Ambulatory Visit: Payer: Self-pay

## 2020-03-23 ENCOUNTER — Ambulatory Visit (INDEPENDENT_AMBULATORY_CARE_PROVIDER_SITE_OTHER)
Admission: RE | Admit: 2020-03-23 | Discharge: 2020-03-23 | Disposition: A | Discharge status: Home / Self Care | Payer: BC Managed Care – PPO | Source: Ambulatory Visit | Attending: Internal Medicine | Admitting: Internal Medicine

## 2020-03-23 DIAGNOSIS — J849 Interstitial pulmonary disease, unspecified: Secondary | ICD-10-CM | POA: Diagnosis not present

## 2020-03-24 ENCOUNTER — Other Ambulatory Visit (HOSPITAL_COMMUNITY)
Admission: RE | Admit: 2020-03-24 | Discharge: 2020-03-24 | Disposition: A | Discharge status: Home / Self Care | Payer: BC Managed Care – PPO | Source: Ambulatory Visit | Attending: Internal Medicine | Admitting: Internal Medicine

## 2020-03-24 DIAGNOSIS — Z20822 Contact with and (suspected) exposure to covid-19: Secondary | ICD-10-CM | POA: Insufficient documentation

## 2020-03-24 DIAGNOSIS — Z01812 Encounter for preprocedural laboratory examination: Secondary | ICD-10-CM | POA: Diagnosis not present

## 2020-03-24 LAB — SARS CORONAVIRUS 2 (TAT 6-24 HRS): SARS Coronavirus 2: NEGATIVE

## 2020-03-27 ENCOUNTER — Ambulatory Visit (INDEPENDENT_AMBULATORY_CARE_PROVIDER_SITE_OTHER): Payer: BC Managed Care – PPO | Admitting: Internal Medicine

## 2020-03-27 ENCOUNTER — Other Ambulatory Visit: Payer: Self-pay

## 2020-03-27 DIAGNOSIS — J842 Lymphoid interstitial pneumonia: Secondary | ICD-10-CM

## 2020-03-27 LAB — PULMONARY FUNCTION TEST
DL/VA % pred: 90 %
DL/VA: 3.87 ml/min/mmHg/L
DLCO cor % pred: 78 %
DLCO cor: 15.02 ml/min/mmHg
DLCO unc % pred: 78 %
DLCO unc: 15.02 ml/min/mmHg
FEF 25-75 Post: 1.58 L/sec
FEF 25-75 Pre: 1.05 L/sec
FEF2575-%Change-Post: 51 %
FEF2575-%Pred-Post: 70 %
FEF2575-%Pred-Pre: 46 %
FEV1-%Change-Post: 11 %
FEV1-%Pred-Post: 67 %
FEV1-%Pred-Pre: 60 %
FEV1-Post: 1.62 L
FEV1-Pre: 1.45 L
FEV1FVC-%Change-Post: 2 %
FEV1FVC-%Pred-Pre: 95 %
FEV6-%Change-Post: 9 %
FEV6-%Pred-Post: 71 %
FEV6-%Pred-Pre: 65 %
FEV6-Post: 2.14 L
FEV6-Pre: 1.94 L
FEV6FVC-%Change-Post: 0 %
FEV6FVC-%Pred-Post: 103 %
FEV6FVC-%Pred-Pre: 102 %
FVC-%Change-Post: 9 %
FVC-%Pred-Post: 69 %
FVC-%Pred-Pre: 63 %
FVC-Post: 2.14 L
FVC-Pre: 1.96 L
Post FEV1/FVC ratio: 76 %
Post FEV6/FVC ratio: 100 %
Pre FEV1/FVC ratio: 74 %
Pre FEV6/FVC Ratio: 99 %
RV % pred: 166 %
RV: 3.17 L
TLC % pred: 108 %
TLC: 5.21 L

## 2020-03-27 NOTE — Progress Notes (Signed)
Full PFT performed today. °

## 2020-03-29 ENCOUNTER — Ambulatory Visit: Payer: BC Managed Care – PPO | Admitting: Pulmonary Disease

## 2020-03-29 ENCOUNTER — Other Ambulatory Visit: Payer: Self-pay

## 2020-03-29 ENCOUNTER — Encounter: Payer: Self-pay | Admitting: Pulmonary Disease

## 2020-03-29 VITALS — BP 124/76 | HR 80 | Temp 97.6°F | Ht 63.0 in | Wt 179.8 lb

## 2020-03-29 DIAGNOSIS — J842 Lymphoid interstitial pneumonia: Secondary | ICD-10-CM

## 2020-03-29 DIAGNOSIS — M3502 Sicca syndrome with lung involvement: Secondary | ICD-10-CM

## 2020-03-29 DIAGNOSIS — R0683 Snoring: Secondary | ICD-10-CM | POA: Insufficient documentation

## 2020-03-29 DIAGNOSIS — J45991 Cough variant asthma: Secondary | ICD-10-CM

## 2020-03-29 DIAGNOSIS — M3500 Sicca syndrome, unspecified: Secondary | ICD-10-CM | POA: Insufficient documentation

## 2020-03-29 HISTORY — DX: Snoring: R06.83

## 2020-03-29 MED ORDER — PREDNISONE 10 MG PO TABS: ORAL_TABLET | ORAL | 0 refills | Status: AC

## 2020-03-29 NOTE — Patient Instructions (Addendum)
You were seen today by Lauraine Rinne, NP  for:   1. 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 Sjogren's syndrome with lung involvement ([place])  - predniSONE (DELTASONE) 10 MG tablet; Take 4 tablets (40 mg total) by mouth daily with breakfast for 7 days, THEN 3 tablets (30 mg total) daily with breakfast for 7 days, THEN 2 tablets (20 mg total) daily with breakfast for 7 days, THEN 1 tablet (10 mg total) daily with breakfast for 7 days.  Dispense: 70 tablet; Refill: 0  Continue follow-up with rheumatology  Continue Imuran  We will start you on a prednisone taper  We will get you established with Dr. Vaughan Browner at your request in a 30-minute time slot  3. Cough variant asthma  Continue Symbicort 160 >>> 2 puffs in the morning right when you wake up, rinse out your mouth after use, 12 hours later 2 puffs, rinse after use >>> Take this daily, no matter what >>> This is not a rescue inhaler     We recommend today:  Meds ordered this encounter  Medications  . predniSONE (DELTASONE) 10 MG tablet    Sig: Take 4 tablets (40 mg total) by mouth daily with breakfast for 7 days, THEN 3 tablets (30 mg total) daily with breakfast for 7 days, THEN 2 tablets (20 mg total) daily with breakfast for 7 days, THEN 1 tablet (10 mg total) daily with breakfast for 7 days.    Dispense:  70 tablet    Refill:  0    Follow Up:    Return in about 2 months (around 05/29/2020), or if symptoms worsen or fail to improve, for Follow up with Dr. Vaughan Browner, ILD clinic - 39min slot, 30 MINUTE SLOT.   Please do your part to reduce the spread of COVID-19:      Reduce your risk of any infection  and COVID19 by using the similar precautions used for avoiding the common cold or flu:  Marland Kitchen Wash your hands often with soap and warm water for at least 20 seconds.  If soap and water are not readily available, use an alcohol-based hand sanitizer with at least 60% alcohol.  . If coughing or sneezing, cover your mouth and nose by  coughing or sneezing into the elbow areas of your shirt or coat, into a tissue or into your sleeve (not your hands). Langley Gauss A MASK when in public  . Avoid shaking hands with others and consider head nods or verbal greetings only. . Avoid touching your eyes, nose, or mouth with unwashed hands.  . Avoid close contact with people who are sick. . Avoid places or events with large numbers of people in one location, like concerts or sporting events. . If you have some symptoms but not all symptoms, continue to monitor at home and seek medical attention if your symptoms worsen. . If you are having a medical emergency, call 911.   [place] / e-Visit: [email or web address]         [facility]: [facility]: [id number]                   [facility]: [id number]     It is flu season:   >>> Best ways to protect herself from the flu: Receive the yearly flu vaccine, practice good hand hygiene washing with soap and also using hand sanitizer when available, eat a nutritious meals, get adequate rest, hydrate  appropriately   Please contact the office if your symptoms worsen or you have concerns that you are not improving.   Thank you for choosing [facility] for your healthcare, and for allowing [place] to partner with you on your healthcare journey. I am thankful to be able to provide care to you today.   Wyn Quaker FNP-C

## 2020-03-29 NOTE — Assessment & Plan Note (Signed)
Plan: Continue Symbicort 160 

## 2020-03-29 NOTE — Assessment & Plan Note (Signed)
Plan: Start prednisone taper today Continue Imuran Keep follow-up with rheumatology

## 2020-03-29 NOTE — Progress Notes (Signed)
@Patient  ID: Donna Fields, female    DOB: 12/10/1960, 59 y.o.   MRN: [id number]  Chief Complaint  Patient presents with   Follow-up    pt states sob needs rescue inhaler more oftentwo or three times a day every day.    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  She is managed in rheumatology by Dr. Kathlene November.  Patient previously been managed on Imuran.  There was a suspicion based off rheumatology assessment that Sjogren's was not well controlled with Imuran.  Pulmonary function testing was moved up as well as a high-resolution CT chest coordinated by Dr. Melvyn Novas.  Patient presenting today to discuss those.  Last pulmonary function testing listed below:  05/16/2019- pulmonary function test- FVC 1.93 (61% predicted), postbronchodilator ratio 78, postbronchodilator FEV1 1.62 (67% predicted), improvement after bronchodilator and FEV1 as well as mid flow, DLCO 87  Patient's pulmonary function test today as well as high-resolution CT chest recently completed listed below:  03/27/2020-pulmonary function test-FVC 1.96 (63% predicted), post bronchodilator ratio 76, postbronchodilator FEV1 1.62 (67% predicted), no bronchodilator response, mid flow reversibility, DLCO 15.02 (78% predicted) >>>Patient use Symbicort prior to breathing test  03/23/2020-CT chest high-res-innumerable thin-walled air cysts and irregular solid perilymphatic distribution pulmonary nodules throughout both lungs, compatible with lymphoid interstitial pneumonia in this patient with known Sjogren's disease, air cells have mildly progressed since 05/31/2019 chest CT, the nodules are stable, findings are  suggestive for an alternative diagnosis not UIP per consensus guidelines, stable right adrenal adenoma, aortic arthrosclerosis  Patient works full-time for the [employer] reports that she has noted progressive shortness of breath and increased fatigue over the last 2 to 3 months.  She reports its becoming difficult to do some of her IADLs and ADLs due to the shortness of breath.  Patient has been using her rescue inhaler more often despite using her Symbicort 160.  Rheumatology is planning on potentially switching patient from Imuran to a different immune suppressant for better control of her Sjogren's syndrome.  Her next follow-up with rheumatology is in a month or so she says.  Questionaires / Pulmonary Flowsheets:   MMRC: mMRC Dyspnea Scale mMRC Score  03/29/2020 3    Tests:   - PFT's  12/20/15   FEV1 2.03 (79 % ) ratio 76  p 7 % improvement from saba with DLCO  78 % corrects to 85  % for alv volume     Vs prev study 10/19/12 FVC down from 3.17 to 2.62 and FEV1 down from 2.33 to 1.88 (roughly the same %) but dlco no change  PFT's  07/25/2016  FEV1 1.86 (73 % ) ratio 75  p 13 % improvement from saba p nothing prior to study with DLCO  77/78 % corrects to 86 % for alv volume and fvc down to 2.26  - PFT's 04/27/2017  FVC 2.64 (81%)  p saba and DLCO 69% corrects to 88%  - See CT 01/26/18 c/w progression of LIP  -  02/02/2018  Walked RA x 3 laps @ 185 ft each stopped due to  End of study, nl pace, no desat, very minimal sob     -  PFT's  FVC  2.36 (73%) and no obst off symb x 12h  and dlco 71% and corrects to 81%   05/16/2019- pulmonary function test- FVC 1.93 (61% predicted), postbronchodilator ratio 78, postbronchodilator FEV1 1.62 (67% predicted), improvement after bronchodilator and FEV1 as well as mid flow, DLCO 87  03/27/2020-pulmonary function test-FVC 1.96 (63% predicted), post bronchodilator ratio 76, postbronchodilator FEV1 1.62 (67% predicted), no bronchodilator response, mid flow reversibility,  DLCO 15.02 (78% predicted) >>>Patient use Symbicort prior to breathing test  01/26/2018-CT chest with contrast-numerous thin-walled air cysts of various sizes scattered throughout both lungs some mildly increased in size since 11/07/2015 CT chest, innumerable irregular pulmonary nodules/nodular opacities in the perilymphatic distribution throughout both lungs many new or increase in some stable and interval coming these findings are most compatible with progression of ILD and the lymphoid interstitial pneumonia (LIP) pattern, compatible with patient's reported history of Sjogren syndrome, continued high-resolution CT chest follow-up is advised in 12 months, mild bilateral axillary adenopathy is unchanged, stable right adrenal adenoma  03/23/2020-CT chest high-res-innumerable thin-walled air cysts and irregular solid perilymphatic distribution pulmonary nodules throughout both lungs, compatible with lymphoid interstitial pneumonia in this patient with known Sjogren's disease, air cells have mildly progressed since 05/31/2019 chest CT, the nodules are stable, findings are suggestive for an alternative diagnosis not UIP per consensus guidelines, stable right adrenal adenoma, aortic arthrosclerosis  FENO:  Lab Results  Component Value Date   NITRICOXIDE 111 07/25/2016    PFT: PFT Results Latest Ref Rng & Units 03/27/2020 05/16/2019 05/13/2018 04/27/2017 07/25/2016 12/20/2015  FVC-Pre L 1.96 1.93 2.36 2.30 - 2.62  FVC-Predicted Pre % 63 61 73 70 69 79  FVC-Post L 2.14 2.08 2.34 2.64 2.47 2.67  FVC-Predicted Post % 69 66 72 81 75 81  Pre FEV1/FVC % % 74 74 73 73 72 72  Post FEV1/FCV % % 76 78 76 78 75 76  FEV1-Pre L 1.45 1.43 1.72 1.68 1.63 1.88  FEV1-Predicted Pre % 60 59 68 66 64 73  FEV1-Post L 1.62 1.62 1.78 2.05 1.86 2.03  DLCO UNC% % 78 87 71 69 77 78  DLCO COR %Predicted % 90 105 81 77 86 85  TLC L 5.21 5.15 5.06 4.75 4.82 5.17  TLC % Predicted % 108 107 103 96 98 105  RV % Predicted % 166 159 145 130  134 137    WALK:  SIX MIN WALK 03/29/2020 05/16/2019 02/02/2018  Supplimental Oxygen during Test? (L/min) - - No  Tech Comments: pt walked at even fast paced//sb denies SOB, good pace, no desaturation. normal pace/very min SOB//lmr    Imaging: CT Chest High Resolution  Result Date: 03/23/2020 CLINICAL DATA:  Sjogren disease. Follow-up interstitial lung disease. Worsening dyspnea with exertion. EXAM: CT CHEST WITHOUT CONTRAST TECHNIQUE: Multidetector CT imaging of the chest was performed following the standard protocol without intravenous contrast. High resolution imaging of the lungs, as well as inspiratory and expiratory imaging, was performed. COMPARISON:  05/31/2019 high-resolution chest CT. FINDINGS: Cardiovascular: Normal heart size. No significant pericardial effusion/thickening. Great vessels are normal in course and caliber. Mediastinum/Nodes: No discrete thyroid nodules. Unremarkable esophagus. Shoddy subcentimeter bilateral axillary lymph nodes are unchanged. No pathologically enlarged mediastinal or discrete hilar nodes on this noncontrast scan. Lungs/Pleura: No pneumothorax. No pleural effusion. No acute consolidative airspace disease. Innumerable thin-walled air cysts and irregular solid pulmonary nodules of varying size scattered throughout both lungs, with the pulmonary nodules located in a perilymphatic distribution with subpleural, perifissural and peribronchovascular involvement. No  significant regions traction bronchiectasis or frank honeycombing. The pulmonary air cysts appear mildly increased. Representative 2.6 cm peripheral right upper lobe air cyst (series 3/image 49), previously 2.4 cm. Dominant apical left upper lobe 7.6 cm air cyst (series 3/image 25), previously 6.8 cm. The irregular nodules appear stable. Representative 3.7 cm irregular anterior right upper lobe nodule (series 3/image 50), previously 3.7 cm. Representative 3.6 cm regular peripheral left lower lobe nodule (series  3/image 102), previously 3.6 cm. Representative 1.7 cm anterior right upper lobe nodule (series 3/image 95), previously 1.7 cm. No new significant pulmonary nodules. No significant lobular air trapping on the expiration sequence. Upper abdomen: Right adrenal 1.4 cm nodule with density 14 HU, stable in size, compatible with a benign adenoma. Musculoskeletal: No aggressive appearing focal osseous lesions. Moderate thoracic spondylosis. IMPRESSION: 1. Innumerable thin-walled air cysts and irregular solid perilymphatic distribution pulmonary nodules throughout both lungs, compatible with lymphoid interstitial pneumonia (LIP) in this patient with known Sjogren disease. The air cysts have mildly progressed since 05/31/2019 chest CT. The nodules are stable. Findings are suggestive of an alternative diagnosis (not UIP) per consensus guidelines: Diagnosis of Idiopathic Pulmonary Fibrosis: An Official ATS/ERS/JRS/ALAT Clinical Practice Guideline. [place], Iss 5, [phone], Jul 25 2017. 2. Stable right adrenal adenoma. 3. Aortic Atherosclerosis (ICD10-I70.0). Electronically Signed   By: Ilona Sorrel M.D.   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   - PFT's  12/20/15   FEV1 2.03 (79 % )  ratio 76  p 7 % improvement from saba with DLCO  78 % corrects to 85  % for alv volume     Vs prev study 10/19/12 FVC down from 3.17 to 2.62 and FEV1 down from 2.33 to 1.88 (roughly the same %) but dlco no change  PFT's  07/25/2016  FEV1 1.86 (73 % ) ratio 75  p 13 % improvement from saba p nothing prior to study with DLCO  77/78 % corrects to 86 % for alv volume and fvc down to 2.26  - PFT's 04/27/2017  FVC 2.64 (81%)  p saba and DLCO 69% corrects to 88%  - See CT 01/26/18 c/w progression of LIP  -  02/02/2018  Walked RA x 3 laps @ 185 ft each stopped due to  End of study, nl pace, no desat, very minimal sob     - PFT's  FVC  2.36 (73%) and no obst off symb x 12h  and dlco 71% and corrects to 81%   - HRCT 03/23/2020 1. Innumerable thin-walled air cysts and irregular solid perilymphatic distribution pulmonary nodules throughout both lungs, compatible with lymphoid interstitial pneumonia (LIP) in this patient with known Sjogren disease. The air cysts have mildly progressed since 05/31/2019 chest CT.  Cough variant asthma    FENO 07/25/2016  =   111 -  07/25/2016   try symbicort 80 2bid  - PFT's  04/27/2017  FEV1 2.05 (80 % ) ratio 78  p 21 % improvement from saba p nothing prior to study with DLCO  69/69c % corrects to 77  % for alv volume   - 05/13/2018  After extensive coaching inhaler device  effectiveness =    75% (too Ti) - PFTs 05/13/2018 min curvature s symbicort prior so rec symb 160 2bid prn        Snoring      No Known Allergies  Immunization History  Administered Date(s) Administered   Influenza Split 08/25/2015   Influenza-Unspecified 09/08/2017, 08/24/2018   PFIZER SARS-COV-2 Vaccination 01/28/2020, 02/18/2020    Past Medical History:  Diagnosis Date   Sjogren's syndrome ([place])     Tobacco History: Social History   Tobacco Use  Smoking Status Never Smoker  Smokeless Tobacco Never Used   Counseling given: Not Answered  Continue to not smoke  Outpatient Encounter  Medications as of 03/29/2020  Medication Sig   albuterol (PROAIR HFA) 108 (90 BASE) MCG/ACT inhaler Inhale 2 puffs into the lungs every 6 (six) hours as needed for wheezing or shortness of breath.   azaTHIOprine (IMURAN) 50 MG tablet Take 50 mg by mouth 3 (three) times daily.   budesonide-formoterol (SYMBICORT) 160-4.5 MCG/ACT inhaler Inhale 2 puffs into the lungs 2 (two) times daily.   Cetirizine HCl (ZYRTEC ALLERGY) 10 MG CAPS Take by mouth.   cholecalciferol (VITAMIN D) 1000 UNITS tablet Take 1,000 Units by mouth daily.   cyclobenzaprine (FLEXERIL) 10 MG tablet Take 10 mg by mouth daily.   DULoxetine (CYMBALTA) 60 MG capsule Take 60 mg by mouth daily.   ipratropium (ATROVENT) 0.03 % nasal spray Place 2 sprays into the nose 2 (two) times daily.   meloxicam (MOBIC) 15 MG tablet Take 15 mg by mouth daily.   nebivolol (BYSTOLIC) 2.5 MG tablet Take 2.5 mg by mouth daily.   omeprazole (PRILOSEC) 20 MG capsule TAKE 1 CAPSULE (20 MG TOTAL) BY MOUTH 2 (TWO) TIMES DAILY BEFORE A MEAL.   acyclovir (ZOVIRAX) 400 MG tablet Take 400 mg by mouth daily.   chlorpheniramine (CHLOR-TRIMETON) 4 MG tablet Take 4 mg by mouth daily.   pravastatin (PRAVACHOL) 10 MG tablet Take 10 mg by mouth daily.   predniSONE (DELTASONE) 10 MG tablet Take 4 tablets (40 mg total) by mouth daily with breakfast for 7 days, THEN 3 tablets (30 mg total) daily with breakfast for 7 days, THEN 2 tablets (20 mg total) daily with breakfast for 7 days, THEN 1 tablet (10 mg total) daily with breakfast for 7 days.   [DISCONTINUED] predniSONE (DELTASONE) 10 MG tablet Take 10 mg by mouth 2 (two) times daily.   No facility-administered encounter medications on file as of 03/29/2020.     Review of Systems  Review of Systems  Constitutional: Positive for fatigue (worsened over last 2-3 months ). Negative for activity change and fever.  HENT: Negative for sinus pressure, sinus pain and sore throat.   Respiratory: Positive for  shortness of breath. Negative for cough and wheezing.   Cardiovascular: Negative for chest pain and palpitations.  Gastrointestinal: Negative for diarrhea, nausea and vomiting.  Musculoskeletal: Negative for arthralgias.  Neurological: Negative for dizziness.  Psychiatric/Behavioral: Negative for sleep disturbance. The patient is not nervous/anxious.      Physical Exam  BP 124/76 (BP Location: Left Arm, Cuff  Size: Normal)    Pulse 80    Temp 97.6 F (36.4 C) (Temporal)    Ht 5\' 3"  (1.6 m)    Wt 179 lb 12.8 oz (81.6 kg)    SpO2 96%    BMI 31.85 kg/m   Wt Readings from Last 5 Encounters:  03/29/20 179 lb 12.8 oz (81.6 kg)  05/16/19 180 lb 9.6 oz (81.9 kg)  05/13/18 182 lb 3.2 oz (82.6 kg)  02/02/18 181 lb 6.4 oz (82.3 kg)  04/27/17 171 lb (77.6 kg)    BMI Readings from Last 5 Encounters:  03/29/20 31.85 kg/m  05/16/19 32.77 kg/m  05/13/18 32.28 kg/m  02/02/18 32.13 kg/m  04/27/17 30.29 kg/m     Physical Exam Vitals and nursing note reviewed.  Constitutional:      General: She is not in acute distress.    Appearance: Normal appearance. She is obese.  HENT:     Head: Normocephalic and atraumatic.     Right Ear: Tympanic membrane, ear canal and external ear normal. There is no impacted cerumen.     Left Ear: Tympanic membrane, ear canal and external ear normal. There is no impacted cerumen.     Nose: Nose normal. No congestion.     Mouth/Throat:     Mouth: Mucous membranes are moist.     Pharynx: Oropharynx is clear.     Comments: Mallampati 2 Eyes:     Pupils: Pupils are equal, round, and reactive to light.  Cardiovascular:     Rate and Rhythm: Normal rate and regular rhythm.     Pulses: Normal pulses.     Heart sounds: Normal heart sounds. No murmur.  Pulmonary:     Effort: Pulmonary effort is normal. No respiratory distress.     Breath sounds: No decreased air movement. No decreased breath sounds, wheezing or rales.  Musculoskeletal:     Cervical back: Normal  range of motion.  Skin:    General: Skin is warm and dry.     Capillary Refill: Capillary refill takes less than 2 seconds.  Neurological:     General: No focal deficit present.     Mental Status: She is alert and oriented to person, place, and time. Mental status is at baseline.     Gait: Gait normal.  Psychiatric:        Mood and Affect: Mood normal.        Behavior: Behavior normal.        Thought Content: Thought content normal.        Judgment: Judgment normal.       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 Vaughan Browner in a 30-minute ILD clinic slot Start prednisone taper today Continue Imuran Keep follow-up with rheumatology  Cough variant asthma Plan: Continue Symbicort 160    Return in about 2 months (around 05/29/2020), or if symptoms worsen or fail to improve, for  Follow up with Dr. Vaughan Browner, ILD clinic - 26min slot, [place].   Lauraine Rinne, NP 03/29/2020   This appointment required 32 minutes of patient care (this includes precharting, chart review, review of results, face-to-face care, etc.).

## 2020-03-29 NOTE — Assessment & Plan Note (Signed)
Plan: At next office visit need to assess Epworth score as well as potentially consider ordering a home sleep study

## 2020-03-29 NOTE — Assessment & Plan Note (Signed)
Assessment: March/2019 CT chest with contrast shows LIP pattern, mildly progressed April/2021 high-resolution CT chest shows mild progression in LI P pattern Function testing showing stable restriction, slight diffusion defect Known ILD due to Sjogren's, followed by rheumatology, on Imuran Walk in office today, patient completed 3 laps and tolerated well  Plan: Patient would like to establish with Dr. Vaughan Browner in a 30-minute ILD clinic slot Start prednisone taper today Continue Imuran Keep follow-up with rheumatology

## 2020-03-30 NOTE — Progress Notes (Signed)
Discussed results with patient in office.  Nothing further is needed at this time.  Ritta Hammes FNP  

## 2020-04-06 ENCOUNTER — Other Ambulatory Visit (HOSPITAL_COMMUNITY): Payer: BC Managed Care – PPO

## 2020-04-26 ENCOUNTER — Other Ambulatory Visit: Payer: Self-pay | Admitting: Pulmonary Disease

## 2020-04-26 DIAGNOSIS — J842 Lymphoid interstitial pneumonia: Secondary | ICD-10-CM

## 2020-04-26 DIAGNOSIS — M3502 Sicca syndrome with lung involvement: Secondary | ICD-10-CM

## 2020-05-12 ENCOUNTER — Other Ambulatory Visit (HOSPITAL_COMMUNITY): Payer: BC Managed Care – PPO

## 2020-05-15 ENCOUNTER — Ambulatory Visit: Payer: BC Managed Care – PPO | Admitting: Internal Medicine

## 2020-05-23 ENCOUNTER — Ambulatory Visit: Payer: BC Managed Care – PPO | Admitting: Pulmonary Disease

## 2020-05-23 ENCOUNTER — Encounter: Payer: Self-pay | Admitting: Pulmonary Disease

## 2020-05-23 ENCOUNTER — Other Ambulatory Visit: Payer: Self-pay

## 2020-05-23 VITALS — BP 122/78 | HR 88 | Temp 98.1°F | Ht 63.0 in | Wt 178.4 lb

## 2020-05-23 DIAGNOSIS — I272 Pulmonary hypertension, unspecified: Secondary | ICD-10-CM

## 2020-05-23 DIAGNOSIS — J849 Interstitial pulmonary disease, unspecified: Secondary | ICD-10-CM

## 2020-05-23 MED ORDER — MYCOPHENOLATE MOFETIL 500 MG PO TABS
500.0000 mg | ORAL_TABLET | Freq: Two times a day (BID) | ORAL | 5 refills | Status: DC
Start: 2020-05-23 — End: 2020-07-17

## 2020-05-23 NOTE — Patient Instructions (Addendum)
We will get some labs today including CMP, CBC with differential, BMP Schedule echocardiogram Stop azathioprine We will start you on CellCept 500 mg twice daily and refer you to pharmacy for education and training  Follow-up in 1 to 2 months.

## 2020-05-23 NOTE — Addendum Note (Signed)
Addended by: Suzzanne Cloud E on: 05/23/2020 03:19 PM   Modules accepted: Orders

## 2020-05-23 NOTE — Progress Notes (Signed)
Donna Fields    [id number]    24-Mar-1961  Primary Care Physician:Kim, Jeneen Rinks, MD  Referring Physician: [name], MD [address] [place],  [place] [address]  Chief complaint:   Consult for interstitial lung disease, LIP Sjogren's syndrome On Imuran since 2175  HPI: 59 year old with history of Sjogren's syndrome.  This was diagnosed in her 36s Noted to have interstitial lung disease consistent with likely about 4 years ago. Started on Imuran 2 years ago by Dr. Kathlene November, rheumatology CT scan and PFTs show progressive worsening over the years and has been referred for help with management  Chief complaint is dyspnea on exertion, occasional dizziness.  No cough, sputum production.  Denies any lower extremity weakness. She notes that her heart rate goes up with minimal exertion and stays elevated for prolonged.  Pets: Cats.  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 Exposures: No mold, hot tub, Jacuzzi.  No feather pillows or comforters Smoking history: Never smoker Travel history: No significant travel Relevant family history: No significant family history of lung disease  Outpatient Encounter Medications as of 05/23/2020  Medication Sig  . albuterol (PROAIR HFA) 108 (90 BASE) MCG/ACT inhaler Inhale 2 puffs into the lungs every 6 (six) hours as needed for wheezing or shortness of breath.  . azaTHIOprine (IMURAN) 50 MG tablet Take 50 mg by mouth 3 (three) times daily.  . budesonide-formoterol (SYMBICORT) 160-4.5 MCG/ACT inhaler Inhale 2 puffs into the lungs 2 (two) times daily.  . Cetirizine HCl (ZYRTEC ALLERGY) 10 MG CAPS Take by mouth.  . cholecalciferol (VITAMIN D) 1000 UNITS tablet Take 1,000 Units by mouth daily.  . cyclobenzaprine (FLEXERIL) 10 MG tablet Take 10 mg by mouth daily.  . DULoxetine (CYMBALTA) 60 MG capsule Take 60 mg by mouth  daily.  Marland Kitchen ipratropium (ATROVENT) 0.03 % nasal spray Place 2 sprays into the nose 2 (two) times daily.  . meloxicam (MOBIC) 15 MG tablet Take 15 mg by mouth daily.  . nebivolol (BYSTOLIC) 2.5 MG tablet Take 2.5 mg by mouth daily.  Marland Kitchen omeprazole (PRILOSEC) 20 MG capsule TAKE 1 CAPSULE (20 MG TOTAL) BY MOUTH 2 (TWO) TIMES DAILY BEFORE A MEAL.  . pravastatin (PRAVACHOL) 10 MG tablet Take 10 mg by mouth daily.   No facility-administered encounter medications on file as of 05/23/2020.    Allergies as of 05/23/2020  . (No Known Allergies)    Past Medical History:  Diagnosis Date  . Sjogren's syndrome [facility])     Past Surgical History:  Procedure Laterality Date  . NASAL SINUS SURGERY  2000    Family History  Problem Relation Age of Onset  . Breast cancer Mother   . Skin cancer Mother   . Heart disease Father   . Rheum arthritis Father   . Allergies Brother     Social History   Socioeconomic History  . Marital status: Married    Spouse name: Not on file  . Number of children: Not on file  . Years of education: Not on file  . Highest education level: Not on file  Occupational History  . Occupation: Merchandiser, retail  Tobacco Use  . Smoking status: Never Smoker  . Smokeless tobacco: Never Used  Substance and Sexual Activity  . Alcohol use: Yes    Alcohol/week: 0.0 standard drinks    Comment: wine  2 x per wk  . Drug use: No  . Sexual activity: Not on file  Other Topics Concern  . Not on file  Social History Narrative  . Not on file   Social Determinants of Health   Financial Resource Strain:   . Difficulty of Paying Living Expenses:   Food Insecurity:   . Worried About Charity fundraiser in the Last Year:   . Arboriculturist in the Last Year:   Transportation Needs:   . Film/video editor (Medical):   Marland Kitchen Lack of Transportation (Non-Medical):   Physical Activity:   . Days of Exercise per Week:   . Minutes of Exercise per Session:   Stress:   . Feeling of Stress :    Social Connections:   . Frequency of Communication with Friends and Family:   . Frequency of Social Gatherings with Friends and Family:   . Attends Religious Services:   . Active Member of Clubs or Organizations:   . Attends Archivist Meetings:   Marland Kitchen Marital Status:   Intimate Partner Violence:   . Fear of Current or Ex-Partner:   . Emotionally Abused:   Marland Kitchen Physically Abused:   . Sexually Abused:     Review of systems: Review of Systems  Constitutional: Negative for fever and chills.  HENT: Negative.   Eyes: Negative for blurred vision.  Respiratory: as per HPI  Cardiovascular: Negative for chest pain and palpitations.  Gastrointestinal: Negative for vomiting, diarrhea, blood per rectum. Genitourinary: Negative for dysuria, urgency, frequency and hematuria.  Musculoskeletal: Negative for myalgias, back pain and joint pain.  Skin: Negative for itching and rash.  Neurological: Negative for dizziness, tremors, focal weakness, seizures and loss of consciousness.  Endo/Heme/Allergies: Negative for environmental allergies.  Psychiatric/Behavioral: Negative for depression, suicidal ideas and hallucinations.  All other systems reviewed and are negative.  Physical Exam: Blood pressure 122/78, pulse 88, temperature 98.1 F (36.7 C), temperature source Oral, height 5\' 3"  (1.6 m), weight 178 lb 6.4 oz (80.9 kg), SpO2 96 %. Gen:      No acute distress HEENT:  EOMI, sclera anicteric Neck:     No masses; no thyromegaly Lungs:    Clear to auscultation bilaterally; normal respiratory effort CV:         Regular rate and rhythm; no murmurs Abd:      + bowel sounds; soft, non-tender; no palpable masses, no distension Ext:    No edema; adequate peripheral perfusion Skin:      Warm and dry; no rash Neuro: alert and oriented x 3 Psych: normal mood and affect  Data Reviewed: Imaging: High-res CT 05/31/2019-numerous thin-walled pulmonary cysts with basal predominance, superimposed  nodular and masslike pulmonary opacities worsening compared to 2019  High-res CT 03/23/2020-slight worsening of pulmonary cysts.  Stable lung nodules. I have reviewed the images personally.  PFTs: 03/27/2020 FVC 2.14 [69%], FEV1 1.62 [67%], F/F 76, TLC 5.21 [108%], DLCO 15.02 [78%] Minimal obstruction, minimal diffusion defect.   Assessment:  LIP secondary to Sjogren's syndrome CT scan is characteristic of LIP secondary to Sjogren's syndrome.  Although PFTs are stable she has progression of symptoms and CT findings on Imuran. We will be reasonable to change her to CellCept.  She already has been tested for hepatitis and latent TB with QuantiFERON test.  I would like to get an echocardiogram to evaluate pulmonary hypertension as well. Check baseline labs today including CMP, CBC and BMP I will touch base with Dr. Kathlene November regarding  the plan.   Plan/Recommendations: CBC, CMP, BNP Echocardiogram Stop azathioprine, start CellCept 100 mg twice daily Pharmacy referral for training Follow-up in 1 to 2 months.  Marshell Garfinkel MD [facility] 05/23/2020, 2:52 PM  CC: [name], MD

## 2020-05-24 LAB — COMPREHENSIVE METABOLIC PANEL
ALT: 19 U/L (ref 0–35)
AST: 22 U/L (ref 0–37)
Albumin: 4.1 g/dL (ref 3.5–5.2)
Alkaline Phosphatase: 55 U/L (ref 39–117)
BUN: 19 mg/dL (ref 6–23)
CO2: 27 mEq/L (ref 19–32)
Calcium: 9.5 mg/dL (ref 8.4–10.5)
Chloride: 106 mEq/L (ref 96–112)
Creatinine, Ser: 1 mg/dL (ref 0.40–1.20)
GFR: 56.43 mL/min — ABNORMAL LOW (ref >60.00)
Glucose, Bld: 97 mg/dL (ref 70–99)
Potassium: 4.2 mEq/L (ref 3.5–5.1)
Sodium: 138 mEq/L (ref 135–145)
Total Bilirubin: 0.2 mg/dL (ref 0.2–1.2)
Total Protein: 7.1 g/dL (ref 6.0–8.3)

## 2020-05-24 LAB — CBC WITH DIFFERENTIAL/PLATELET
Basophils Absolute: 0 10*3/uL (ref 0.0–0.1)
Basophils Relative: 0.7 % (ref 0.0–3.0)
Eosinophils Absolute: 0.1 10*3/uL (ref 0.0–0.7)
Eosinophils Relative: 3.8 % (ref 0.0–5.0)
HCT: 34.3 % — ABNORMAL LOW (ref 36.0–46.0)
Hemoglobin: 11.6 g/dL — ABNORMAL LOW (ref 12.0–15.0)
Lymphocytes Relative: 18 % (ref 12.0–46.0)
Lymphs Abs: 0.6 10*3/uL — ABNORMAL LOW (ref 0.7–4.0)
MCHC: 33.8 g/dL (ref 30.0–36.0)
MCV: 95.8 fl (ref 78.0–100.0)
Monocytes Absolute: 0.4 10*3/uL (ref 0.1–1.0)
Monocytes Relative: 14.2 % — ABNORMAL HIGH (ref 3.0–12.0)
Neutro Abs: 2 10*3/uL (ref 1.4–7.7)
Neutrophils Relative %: 63.3 % (ref 43.0–77.0)
Platelets: 351 10*3/uL (ref 150.0–400.0)
RBC: 3.58 Mil/uL — ABNORMAL LOW (ref 3.87–5.11)
RDW: 13.7 % (ref 11.5–15.5)
WBC: 3.1 10*3/uL — ABNORMAL LOW (ref 4.0–10.5)

## 2020-05-30 ENCOUNTER — Other Ambulatory Visit: Payer: BC Managed Care – PPO

## 2020-06-01 NOTE — Progress Notes (Signed)
HPI  Patient presents today to [facility] for Initial appt with pharmacy team for CellCept medication management. Pertinent past medical history includes ILD, Sjogren's syndrome, and asthma. Prior therapy includes Imuran which was started in 2019.  She is followed by rheumatologist, Dr. Kathlene November.  Patient states she started CellCept a week ago and is tolerating well without side effects.  OBJECTIVE No Known Allergies  Outpatient Encounter Medications as of 06/04/2020  Medication Sig   albuterol (PROAIR HFA) 108 (90 BASE) MCG/ACT inhaler Inhale 2 puffs into the lungs every 6 (six) hours as needed for wheezing or shortness of breath.   budesonide-formoterol (SYMBICORT) 160-4.5 MCG/ACT inhaler Inhale 2 puffs into the lungs 2 (two) times daily.   Cetirizine HCl (ZYRTEC ALLERGY) 10 MG CAPS Take by mouth.   cholecalciferol (VITAMIN D) 1000 UNITS tablet Take 1,000 Units by mouth daily.   cyclobenzaprine (FLEXERIL) 10 MG tablet Take 10 mg by mouth daily.   DULoxetine (CYMBALTA) 60 MG capsule Take 60 mg by mouth daily.   ipratropium (ATROVENT) 0.03 % nasal spray Place 2 sprays into the nose 2 (two) times daily.   meloxicam (MOBIC) 15 MG tablet Take 15 mg by mouth daily.   mycophenolate (CELLCEPT) 500 MG tablet Take 1 tablet (500 mg total) by mouth 2 (two) times daily.   nebivolol (BYSTOLIC) 2.5 MG tablet Take 2.5 mg by mouth daily.   omeprazole (PRILOSEC) 20 MG capsule TAKE 1 CAPSULE (20 MG TOTAL) BY MOUTH 2 (TWO) TIMES DAILY BEFORE A MEAL.   pravastatin (PRAVACHOL) 10 MG tablet Take 10 mg by mouth daily.   No facility-administered encounter medications on file as of 06/04/2020.     Immunization History  Administered Date(s) Administered   Influenza Split 08/25/2015   Influenza-Unspecified 09/08/2017, 08/24/2018   PFIZER SARS-COV-2 Vaccination 01/28/2020, 02/18/2020     HRCT  PFT's TLC  Date Value Ref Range Status  03/27/2020 5.21 L Final     CMP     Component  Value Date/Time   NA 138 05/23/2020 1520   K 4.2 05/23/2020 1520   CL 106 05/23/2020 1520   CO2 27 05/23/2020 1520   GLUCOSE 97 05/23/2020 1520   BUN 19 05/23/2020 1520   CREATININE 1.00 05/23/2020 1520   CALCIUM 9.5 05/23/2020 1520   PROT 7.1 05/23/2020 1520   ALBUMIN 4.1 05/23/2020 1520   AST 22 05/23/2020 1520   ALT 19 05/23/2020 1520   ALKPHOS 55 05/23/2020 1520   BILITOT 0.2 05/23/2020 1520     CBC    Component Value Date/Time   WBC 3.1 (L) 05/23/2020 1520   RBC 3.58 (L) 05/23/2020 1520   HGB 11.6 (L) 05/23/2020 1520   HCT 34.3 (L) 05/23/2020 1520   PLT 351.0 05/23/2020 1520   MCV 95.8 05/23/2020 1520   MCHC 33.8 05/23/2020 1520   RDW 13.7 05/23/2020 1520   LYMPHSABS 0.6 (L) 05/23/2020 1520   MONOABS 0.4 05/23/2020 1520   EOSABS 0.1 05/23/2020 1520   BASOSABS 0.0 05/23/2020 1520     LFT's Hepatic Function Latest Ref Rng & Units 05/23/2020  Total Protein 6.0 - 8.3 g/dL 7.1  Albumin 3.5 - 5.2 g/dL 4.1  AST 0 - 37 U/L 22  ALT 0 - 35 U/L 19  Alk Phosphatase 39 - 117 U/L 55  Total Bilirubin 0.2 - 1.2 mg/dL 0.2     TB GOLD Negative per patient on 12/14/19.  She works at [employer] and is required to have yearly TB gold screening.  She  will have results faxed to our office.  Hepatitis Panel Negative per patient in 2016.  She had initial screening at work and will have results faxed to our office.   ASSESSMENT  1. Cellcept Medication Management  Patient was counseled on the purpose, proper use, and adverse effects of mycophenolate including risk of infection, new or reactivation of viral infections, nausea, and headaches.  Discussed warning of increased risk of development of lymphoma and other malignancies, particularly of the skin.  Discussed risk of neutropenia and discussed importance of regular labs to monitor blood counts, liver function, and kidney function.  She is to return in 2 weeks, 4 weeks, 8 weeks if dose increased and routine monitoring every 3  months.  Standing orders placed.  Counseled patient on importance of taking PCP prophylaxis while on mycophenolate.  Patient denies sulfa allergy. Prescription for Bactrim 3 [facility].  2. Medication Reconciliation  A drug regimen assessment was performed, including review of allergies, interactions, disease-state management, dosing and immunization history. Medications were reviewed with the patient, including name, instructions, indication, goals of therapy, potential side effects, importance of adherence, and safe use.  Drug interaction(s): Cellcept and omeprazole-decrease the effectiveness of CellCept.  Category C interaction and recommendation to monitor therapy.  Patient has severe acid reflux and is on omeprazole twice daily.  Consider switching to enteric-coated mycophenolate to reduce interaction in the future.  3. Immunizations  Patient is up-to-date with COVID-19 vaccine.  Recommend annual influenza, pneumonia, and Shingrix vaccines.  PLAN  Continue CellCept 500 mg twice daily.  Can consider dose increase to 1000 mg twice daily if labs within normal limits.  Start Bactrim 3 times weekly for PCP prophylaxis  Labs in 2 weeks, 4 weeks, 8 weeks if dose increased and routine monitoring every 3 months.  Standing orders placed.  Recommend annual influenza, pneumonia, and Shingrix vaccines.  All questions encouraged and answered.  Instructed patient to call with any further questions or concerns.  Thank you for allowing pharmacy to participate in this patient's care.  This appointment required  25 minutes of patient care (this includes precharting, chart review, review of results, face-to-face care, etc.).   Mariella Saa, PharmD, [place], CPP Clinical Specialty Pharmacist (Rheumatology and Pulmonology)  06/04/2020 4:30 PM

## 2020-06-04 ENCOUNTER — Ambulatory Visit: Payer: BC Managed Care – PPO | Admitting: Pharmacist

## 2020-06-04 ENCOUNTER — Other Ambulatory Visit: Payer: Self-pay

## 2020-06-04 DIAGNOSIS — J849 Interstitial pulmonary disease, unspecified: Secondary | ICD-10-CM

## 2020-06-04 DIAGNOSIS — Z5181 Encounter for therapeutic drug level monitoring: Secondary | ICD-10-CM

## 2020-06-04 DIAGNOSIS — Z7189 Other specified counseling: Secondary | ICD-10-CM

## 2020-06-04 MED ORDER — SULFAMETHOXAZOLE-TRIMETHOPRIM 800-160 MG PO TABS: 1.0000 | ORAL_TABLET | ORAL | 5 refills | Status: DC

## 2020-06-15 ENCOUNTER — Ambulatory Visit (HOSPITAL_COMMUNITY): Payer: BC Managed Care – PPO | Attending: Cardiology

## 2020-06-15 ENCOUNTER — Other Ambulatory Visit: Payer: Self-pay

## 2020-06-15 DIAGNOSIS — I272 Pulmonary hypertension, unspecified: Secondary | ICD-10-CM | POA: Diagnosis not present

## 2020-06-15 LAB — ECHOCARDIOGRAM COMPLETE
Area-P 1/2: 3.77 cm2
S' Lateral: 3.1 cm

## 2020-07-17 ENCOUNTER — Other Ambulatory Visit: Payer: Self-pay

## 2020-07-17 ENCOUNTER — Ambulatory Visit: Payer: BC Managed Care – PPO | Admitting: Pulmonary Disease

## 2020-07-17 ENCOUNTER — Encounter: Payer: Self-pay | Admitting: Pulmonary Disease

## 2020-07-17 VITALS — BP 128/78 | HR 94 | Temp 97.9°F | Ht 63.0 in | Wt 177.4 lb

## 2020-07-17 DIAGNOSIS — M3502 Sicca syndrome with lung involvement: Secondary | ICD-10-CM

## 2020-07-17 DIAGNOSIS — Z5181 Encounter for therapeutic drug level monitoring: Secondary | ICD-10-CM

## 2020-07-17 DIAGNOSIS — J849 Interstitial pulmonary disease, unspecified: Secondary | ICD-10-CM

## 2020-07-17 DIAGNOSIS — J842 Lymphoid interstitial pneumonia: Secondary | ICD-10-CM | POA: Diagnosis not present

## 2020-07-17 LAB — CBC WITH DIFFERENTIAL/PLATELET
Basophils Absolute: 0 10*3/uL (ref 0.0–0.1)
Basophils Relative: 0.6 % (ref 0.0–3.0)
Eosinophils Absolute: 0.1 10*3/uL (ref 0.0–0.7)
Eosinophils Relative: 3.3 % (ref 0.0–5.0)
HCT: 36.1 % (ref 36.0–46.0)
Hemoglobin: 11.9 g/dL — ABNORMAL LOW (ref 12.0–15.0)
Lymphocytes Relative: 15.2 % (ref 12.0–46.0)
Lymphs Abs: 0.6 10*3/uL — ABNORMAL LOW (ref 0.7–4.0)
MCHC: 32.8 g/dL (ref 30.0–36.0)
MCV: 94 fl (ref 78.0–100.0)
Monocytes Absolute: 0.5 10*3/uL (ref 0.1–1.0)
Monocytes Relative: 11.8 % (ref 3.0–12.0)
Neutro Abs: 2.7 10*3/uL (ref 1.4–7.7)
Neutrophils Relative %: 69.1 % (ref 43.0–77.0)
Platelets: 311 10*3/uL (ref 150.0–400.0)
RBC: 3.84 Mil/uL — ABNORMAL LOW (ref 3.87–5.11)
RDW: 13.7 % (ref 11.5–15.5)
WBC: 3.9 10*3/uL — ABNORMAL LOW (ref 4.0–10.5)

## 2020-07-17 LAB — COMPREHENSIVE METABOLIC PANEL
ALT: 19 U/L (ref 0–35)
AST: 19 U/L (ref 0–37)
Albumin: 4.1 g/dL (ref 3.5–5.2)
Alkaline Phosphatase: 64 U/L (ref 39–117)
BUN: 16 mg/dL (ref 6–23)
CO2: 26 mEq/L (ref 19–32)
Calcium: 9.4 mg/dL (ref 8.4–10.5)
Chloride: 105 mEq/L (ref 96–112)
Creatinine, Ser: 0.8 mg/dL (ref 0.40–1.20)
GFR: 72.96 mL/min (ref >60.00)
Glucose, Bld: 93 mg/dL (ref 70–99)
Potassium: 4.4 mEq/L (ref 3.5–5.1)
Sodium: 137 mEq/L (ref 135–145)
Total Bilirubin: 0.3 mg/dL (ref 0.2–1.2)
Total Protein: 7.4 g/dL (ref 6.0–8.3)

## 2020-07-17 MED ORDER — MYCOPHENOLATE MOFETIL 500 MG PO TABS: 1000.0000 mg | ORAL_TABLET | Freq: Two times a day (BID) | ORAL | 2 refills | Status: DC

## 2020-07-17 NOTE — Progress Notes (Signed)
Donna Fields    [id number]    05/31/1961  Primary Care Physician:Kim, Jeneen Rinks, MD  Referring Physician: [name], MD [address] [place],  [place] [address]  Problem list:   Interstitial lung disease, LIP Sjogren's syndrome On Imuran since 2019, switched to Cellcept July 6635  HPI: 59 year old with history of Sjogren's syndrome.  This was diagnosed in her 26s Noted to have interstitial lung disease consistent with likely about 4 years ago. Started on Imuran 2 years ago by Dr. Kathlene November, rheumatology CT scan and PFTs show progressive worsening over the years and has been referred for help with management  Chief complaint is dyspnea on exertion, occasional dizziness.  No cough, sputum production.  Denies any lower extremity weakness. She notes that her heart rate goes up with minimal exertion and stays elevated for prolonged.  Pets: Cats.  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 Exposures: No mold, hot tub, Jacuzzi.  No feather pillows or comforters Smoking history: Never smoker Travel history: No significant travel Relevant family history: No significant family history of lung disease  Interim sick: Switched to [employer] this dose well with no issues.  States his breathing is doing well.  Outpatient Encounter Medications as of 07/17/2020  Medication Sig  . albuterol (PROAIR HFA) 108 (90 BASE) MCG/ACT inhaler Inhale 2 puffs into the lungs every 6 (six) hours as needed for wheezing or shortness of breath.  . budesonide-formoterol (SYMBICORT) 160-4.5 MCG/ACT inhaler Inhale 2 puffs into the lungs 2 (two) times daily.  . Cetirizine HCl (ZYRTEC ALLERGY) 10 MG CAPS Take by mouth.  . cholecalciferol (VITAMIN D) 1000 UNITS tablet Take 1,000 Units by mouth daily.  . cyclobenzaprine (FLEXERIL) 10 MG tablet Take 10 mg by mouth daily.  .  DULoxetine (CYMBALTA) 60 MG capsule Take 60 mg by mouth daily.  Marland Kitchen ipratropium (ATROVENT) 0.03 % nasal spray Place 2 sprays into the nose 2 (two) times daily.  . meloxicam (MOBIC) 15 MG tablet Take 15 mg by mouth daily.  . mycophenolate (CELLCEPT) 500 MG tablet Take 1 tablet (500 mg total) by mouth 2 (two) times daily.  . nebivolol (BYSTOLIC) 2.5 MG tablet Take 2.5 mg by mouth daily.  Marland Kitchen omeprazole (PRILOSEC) 20 MG capsule TAKE 1 CAPSULE (20 MG TOTAL) BY MOUTH 2 (TWO) TIMES DAILY BEFORE A MEAL.  . pravastatin (PRAVACHOL) 10 MG tablet Take 10 mg by mouth once a week.   . Probiotic Product (PROBIOTIC-10 PO)   . sulfamethoxazole-trimethoprim (BACTRIM DS) 800-160 MG tablet Take 1 tablet by mouth 3 (three) times a week.  Marland Kitchen XIIDRA 5 % SOLN 2 drops 2 (two) times daily.   No facility-administered encounter medications on file as of 07/17/2020.   Physical Exam: Blood pressure 128/78, pulse 94, temperature 97.9 F (36.6 C), temperature source Other (Comment), height 5\' 3"  (1.6 m), weight 177 lb 6.4 oz (80.5 kg), SpO2 98 %. Gen:      No acute distress HEENT:  EOMI, sclera anicteric Neck:     No masses; no thyromegaly Lungs:    Clear to auscultation bilaterally; normal respiratory effort CV:         Regular rate and rhythm; no murmurs Abd:      + bowel sounds; soft, non-tender; no palpable masses, no distension Ext:    No edema; adequate peripheral perfusion Skin:  Warm and dry; no rash Neuro: alert and oriented x 3 Psych: normal mood and affect  Data Reviewed: Imaging: High-res CT 05/31/2019-numerous thin-walled pulmonary cysts with basal predominance, superimposed nodular and masslike pulmonary opacities worsening compared to 2019  High-res CT 03/23/2020-slight worsening of pulmonary cysts.  Stable lung nodules. I have reviewed the images personally.  PFTs: 03/27/2020 FVC 2.14 [69%], FEV1 1.62 [67%], F/F 76, TLC 5.21 [108%], DLCO 15.02 [78%] Minimal obstruction, minimal diffusion  defect.  Labs: CMP 05/23/2020-within normal limits CBC 05/23/2020-WBC 3.1, hemoglobin 11.6  Cardiac: Echocardiogram 06/15/2020-LVEF 60-65%, RV size and function is normal with trivial tricuspid regurgitation, TAPSE 2.2 cm  Assessment:  LIP secondary to Sjogren's syndrome CT scan is characteristic of LIP secondary to Sjogren's syndrome.  Although PFTs are stable she has progression of symptoms and CT findings on Imuran. After discussion with her rheumatologist we switched her to CellCept. She is tolerating the dose well.  Echo reviewed with no pulmonary hypertension.  Check baseline labs today including CMP, CBC. Increase CellCept to 1 g twice daily Continue Bactrim prophylaxis  Plan/Recommendations: Increase CellCept to 1 g twice daily Check CBC, CMP for monitoring  Marshell Garfinkel MD [facility] 07/17/2020, 9:34 AM  CC: [name], MD

## 2020-07-17 NOTE — Patient Instructions (Addendum)
I am glad you are doing well with regard to your breathing We will check some labs today including comprehensive metabolic panel, CBC Increase CellCept dose to 1 g twice daily Continue Bactrim  Follow-up in 3 months.

## 2020-07-17 NOTE — Addendum Note (Signed)
Addended by: Merrilee Seashore on: 07/17/2020 09:55 AM   Modules accepted: Orders

## 2020-07-17 NOTE — Addendum Note (Signed)
Addended by: Suzzanne Cloud E on: 07/17/2020 09:58 AM   Modules accepted: Orders

## 2020-08-23 ENCOUNTER — Other Ambulatory Visit: Payer: Self-pay | Admitting: Pulmonary Disease

## 2020-08-23 DIAGNOSIS — J842 Lymphoid interstitial pneumonia: Secondary | ICD-10-CM

## 2020-08-23 MED ORDER — MYCOPHENOLATE MOFETIL 500 MG PO TABS: 1000.0000 mg | ORAL_TABLET | Freq: Two times a day (BID) | ORAL | 2 refills | Status: DC

## 2020-09-06 ENCOUNTER — Telehealth: Payer: Self-pay | Admitting: Pulmonary Disease

## 2020-09-06 DIAGNOSIS — J842 Lymphoid interstitial pneumonia: Secondary | ICD-10-CM

## 2020-09-06 MED ORDER — MYCOPHENOLATE MOFETIL 500 MG PO TABS: 1000.0000 mg | ORAL_TABLET | Freq: Two times a day (BID) | ORAL | 5 refills | Status: DC

## 2020-09-06 NOTE — Telephone Encounter (Signed)
Called and spoke with pt who stated she [facility] but then they told her she would need to start getting it from [facility]. Pt said last shipment from them took almost a week to get to her and she did not want to run out of the medication. Stated to pt that I would electronically send her Rx to [facility] her and she verbalized understanding.  Verified the phone number that pt had for [facility] so I could make sure I chose the correct one and have electronically sent Rx for pt's cellcept to pharmacy for pt. Nothing further needed.

## 2020-10-15 ENCOUNTER — Telehealth: Payer: Self-pay | Admitting: Pulmonary Disease

## 2020-10-15 DIAGNOSIS — J842 Lymphoid interstitial pneumonia: Secondary | ICD-10-CM

## 2020-10-15 MED ORDER — MYCOPHENOLATE MOFETIL 500 MG PO TABS: 1000.0000 mg | ORAL_TABLET | Freq: Two times a day (BID) | ORAL | 5 refills | Status: DC

## 2020-10-15 NOTE — Telephone Encounter (Signed)
Spoke with the pt  She is needing refill on cellcept Her pharmacy currently only has the 500 mg tablets  I have sent her refill in  Nothing further needed

## 2021-01-14 ENCOUNTER — Other Ambulatory Visit (INDEPENDENT_AMBULATORY_CARE_PROVIDER_SITE_OTHER): Payer: Self-pay

## 2021-01-14 DIAGNOSIS — Z5181 Encounter for therapeutic drug level monitoring: Secondary | ICD-10-CM

## 2021-01-14 LAB — COMPREHENSIVE METABOLIC PANEL
ALT: 18 U/L (ref 0–35)
AST: 17 U/L (ref 0–37)
Albumin: 4.2 g/dL (ref 3.5–5.2)
Alkaline Phosphatase: 77 U/L (ref 39–117)
BUN: 19 mg/dL (ref 6–23)
CO2: 27 mEq/L (ref 19–32)
Calcium: 9.6 mg/dL (ref 8.4–10.5)
Chloride: 104 mEq/L (ref 96–112)
Creatinine, Ser: 0.77 mg/dL (ref 0.40–1.20)
GFR: 83.31 mL/min (ref >60.00)
Glucose, Bld: 81 mg/dL (ref 70–99)
Potassium: 4.2 mEq/L (ref 3.5–5.1)
Sodium: 137 mEq/L (ref 135–145)
Total Bilirubin: 0.3 mg/dL (ref 0.2–1.2)
Total Protein: 7.6 g/dL (ref 6.0–8.3)

## 2021-01-14 LAB — CBC
HCT: 35.4 % — ABNORMAL LOW (ref 36.0–46.0)
Hemoglobin: 11.7 g/dL — ABNORMAL LOW (ref 12.0–15.0)
MCHC: 33 g/dL (ref 30.0–36.0)
MCV: 88.3 fl (ref 78.0–100.0)
Platelets: 375 10*3/uL (ref 150.0–400.0)
RBC: 4.01 Mil/uL (ref 3.87–5.11)
RDW: 13.5 % (ref 11.5–15.5)
WBC: 4.4 10*3/uL (ref 4.0–10.5)

## 2021-03-19 ENCOUNTER — Other Ambulatory Visit: Payer: Self-pay | Admitting: Pulmonary Disease

## 2021-03-19 DIAGNOSIS — J842 Lymphoid interstitial pneumonia: Secondary | ICD-10-CM

## 2021-03-25 ENCOUNTER — Telehealth: Payer: Self-pay | Admitting: Pulmonary Disease

## 2021-03-25 DIAGNOSIS — J849 Interstitial pulmonary disease, unspecified: Secondary | ICD-10-CM

## 2021-03-25 MED ORDER — SULFAMETHOXAZOLE-TRIMETHOPRIM 800-160 MG PO TABS: 1.0000 | ORAL_TABLET | ORAL | 5 refills | Status: DC

## 2021-03-25 NOTE — Telephone Encounter (Signed)
I have called and LM on VM for the pt to call back. Need to find out why she needs a refill on the abx.

## 2021-03-25 NOTE — Telephone Encounter (Signed)
Patient is on continued Bactrim. [facility]. Nothing further at this time.  LOV 07/17/20 with Dr. Vaughan Browner-  Instructions  I am glad you are doing well with regard to your breathing We will check some labs today including comprehensive metabolic panel, CBC Increase CellCept dose to 1 g twice daily Continue Bactrim

## 2021-05-03 ENCOUNTER — Ambulatory Visit (INDEPENDENT_AMBULATORY_CARE_PROVIDER_SITE_OTHER): Payer: BC Managed Care – PPO | Admitting: Adult Health

## 2021-05-03 ENCOUNTER — Other Ambulatory Visit: Payer: Self-pay

## 2021-05-03 VITALS — BP 124/84 | HR 79 | Ht 63.0 in | Wt 174.0 lb

## 2021-05-03 DIAGNOSIS — J8409 Other alveolar and parieto-alveolar conditions: Secondary | ICD-10-CM

## 2021-05-03 DIAGNOSIS — R7303 Prediabetes: Secondary | ICD-10-CM | POA: Insufficient documentation

## 2021-05-03 DIAGNOSIS — R0609 Other forms of dyspnea: Secondary | ICD-10-CM | POA: Insufficient documentation

## 2021-05-03 DIAGNOSIS — R0600 Dyspnea, unspecified: Secondary | ICD-10-CM | POA: Insufficient documentation

## 2021-05-03 DIAGNOSIS — B029 Zoster without complications: Secondary | ICD-10-CM | POA: Insufficient documentation

## 2021-05-03 DIAGNOSIS — Z79899 Other long term (current) drug therapy: Secondary | ICD-10-CM | POA: Insufficient documentation

## 2021-05-03 DIAGNOSIS — M1990 Unspecified osteoarthritis, unspecified site: Secondary | ICD-10-CM | POA: Insufficient documentation

## 2021-05-03 DIAGNOSIS — E7800 Pure hypercholesterolemia, unspecified: Secondary | ICD-10-CM

## 2021-05-03 DIAGNOSIS — R739 Hyperglycemia, unspecified: Secondary | ICD-10-CM

## 2021-05-03 DIAGNOSIS — R591 Generalized enlarged lymph nodes: Secondary | ICD-10-CM | POA: Insufficient documentation

## 2021-05-03 DIAGNOSIS — J45991 Cough variant asthma: Secondary | ICD-10-CM

## 2021-05-03 DIAGNOSIS — M545 Low back pain, unspecified: Secondary | ICD-10-CM | POA: Insufficient documentation

## 2021-05-03 DIAGNOSIS — M797 Fibromyalgia: Secondary | ICD-10-CM | POA: Insufficient documentation

## 2021-05-03 DIAGNOSIS — E039 Hypothyroidism, unspecified: Secondary | ICD-10-CM | POA: Insufficient documentation

## 2021-05-03 DIAGNOSIS — J842 Lymphoid interstitial pneumonia: Secondary | ICD-10-CM | POA: Diagnosis not present

## 2021-05-03 DIAGNOSIS — E559 Vitamin D deficiency, unspecified: Secondary | ICD-10-CM | POA: Insufficient documentation

## 2021-05-03 DIAGNOSIS — J0110 Acute frontal sinusitis, unspecified: Secondary | ICD-10-CM | POA: Insufficient documentation

## 2021-05-03 DIAGNOSIS — J849 Interstitial pulmonary disease, unspecified: Secondary | ICD-10-CM | POA: Insufficient documentation

## 2021-05-03 DIAGNOSIS — D3500 Benign neoplasm of unspecified adrenal gland: Secondary | ICD-10-CM | POA: Insufficient documentation

## 2021-05-03 DIAGNOSIS — E785 Hyperlipidemia, unspecified: Secondary | ICD-10-CM

## 2021-05-03 DIAGNOSIS — J4541 Moderate persistent asthma with (acute) exacerbation: Secondary | ICD-10-CM | POA: Insufficient documentation

## 2021-05-03 DIAGNOSIS — I10 Essential (primary) hypertension: Secondary | ICD-10-CM | POA: Insufficient documentation

## 2021-05-03 HISTORY — DX: Generalized enlarged lymph nodes: R59.1

## 2021-05-03 HISTORY — DX: Prediabetes: R73.03

## 2021-05-03 HISTORY — DX: Other forms of dyspnea: R06.09

## 2021-05-03 HISTORY — DX: Unspecified osteoarthritis, unspecified site: M19.90

## 2021-05-03 HISTORY — DX: Hyperlipidemia, unspecified: E78.5

## 2021-05-03 HISTORY — DX: Essential (primary) hypertension: I10

## 2021-05-03 HISTORY — DX: Fibromyalgia: M79.7

## 2021-05-03 HISTORY — DX: Pure hypercholesterolemia, unspecified: E78.00

## 2021-05-03 HISTORY — DX: Hyperglycemia, unspecified: R73.9

## 2021-05-03 HISTORY — DX: Vitamin D deficiency, unspecified: E55.9

## 2021-05-03 HISTORY — DX: Low back pain, unspecified: M54.50

## 2021-05-03 HISTORY — DX: Hypothyroidism, unspecified: E03.9

## 2021-05-03 HISTORY — DX: Zoster without complications: B02.9

## 2021-05-03 HISTORY — DX: Other alveolar and parieto-alveolar conditions: J84.09

## 2021-05-03 HISTORY — DX: Interstitial pulmonary disease, unspecified: J84.9

## 2021-05-03 LAB — COMPREHENSIVE METABOLIC PANEL
ALT: 16 U/L (ref 0–35)
AST: 23 U/L (ref 0–37)
Albumin: 4.5 g/dL (ref 3.5–5.2)
Alkaline Phosphatase: 70 U/L (ref 39–117)
BUN: 22 mg/dL (ref 6–23)
CO2: 25 mEq/L (ref 19–32)
Calcium: 9.8 mg/dL (ref 8.4–10.5)
Chloride: 103 mEq/L (ref 96–112)
Creatinine, Ser: 0.8 mg/dL (ref 0.40–1.20)
GFR: 79.41 mL/min (ref >60.00)
Glucose, Bld: 91 mg/dL (ref 70–99)
Potassium: 5 mEq/L (ref 3.5–5.1)
Sodium: 136 mEq/L (ref 135–145)
Total Bilirubin: 0.4 mg/dL (ref 0.2–1.2)
Total Protein: 7.9 g/dL (ref 6.0–8.3)

## 2021-05-03 LAB — CBC WITH DIFFERENTIAL/PLATELET
Basophils Absolute: 0.1 10*3/uL (ref 0.0–0.1)
Basophils Relative: 1.1 % (ref 0.0–3.0)
Eosinophils Absolute: 0.2 10*3/uL (ref 0.0–0.7)
Eosinophils Relative: 3.1 % (ref 0.0–5.0)
HCT: 35.9 % — ABNORMAL LOW (ref 36.0–46.0)
Hemoglobin: 11.8 g/dL — ABNORMAL LOW (ref 12.0–15.0)
Lymphocytes Relative: 21.5 % (ref 12.0–46.0)
Lymphs Abs: 1.1 10*3/uL (ref 0.7–4.0)
MCHC: 33 g/dL (ref 30.0–36.0)
MCV: 88.9 fl (ref 78.0–100.0)
Monocytes Absolute: 0.6 10*3/uL (ref 0.1–1.0)
Monocytes Relative: 12 % (ref 3.0–12.0)
Neutro Abs: 3.3 10*3/uL (ref 1.4–7.7)
Neutrophils Relative %: 62.3 % (ref 43.0–77.0)
Platelets: 334 10*3/uL (ref 150.0–400.0)
RBC: 4.04 Mil/uL (ref 3.87–5.11)
RDW: 14.1 % (ref 11.5–15.5)
WBC: 5.2 10*3/uL (ref 4.0–10.5)

## 2021-05-03 NOTE — Progress Notes (Signed)
@Patient  ID: Donna Fields, female    DOB: [birth year], 60 y.o.   MRN: [id number]  Chief Complaint  Patient presents with   Follow-up     Referring provider: [name], MD  HPI: 60 year old female followed for interstitial lung disease, LIP with associated Sjogren's syndrome Followed by rheumatology maintained on CellCept, Bactrim prophylaxis  TEST/EVENTS :  High-res CT 05/31/2019-numerous thin-walled pulmonary cysts with basal predominance, superimposed nodular and masslike pulmonary opacities worsening compared to 2019   High-res CT 03/23/2020-slight worsening of pulmonary cysts.  Stable lung nodules. I have reviewed the images personally.   PFTs: 03/27/2020 FVC 2.14 [69%], FEV1 1.62 [67%], F/F 76, TLC 5.21 [108%], DLCO 15.02 [78%] Minimal obstruction, minimal diffusion defect.   Labs: CMP 05/23/2020-within normal limits CBC 05/23/2020-WBC 3.1, hemoglobin 11.6   Cardiac: Echocardiogram 06/15/2020-LVEF 60-65%, RV size and function is normal with trivial tricuspid regurgitation, TAPSE 2.2 cm   05/03/2021 Follow up : ILD/LIP secondary to Sjogren's syndrome Patient returns for a follow-up visit.  Last seen August 2021.  Patient says overall breathing is doing about the same.  She has chronic shortness of breath with activity.  She says as long as she is in air conditioning on a flat surface she typically does pretty well.  But if she is outside and there is any humidity or if she has to walk up an incline or do talking while she is walking she does get short of breath and have to rest intermittently.  She continues to work full-time at the [employer]  She has been fully vaccinated for COVID-19 with 2 boosters.  She remains on CellCept.  She says it does cause some occasional stomach upset.  But she has been doing some dietary changes and this seems to have helped some. She does periodically have to take acyclovir for outbreak of herpetic rash on her right hand. She denies any  increased cough change in activity tolerance.  She is not on oxygen. She does have asthma maintained on Symbicort.  She denies any flare of cough or wheezing.  Allergies  Allergen Reactions   Valtrex [Valacyclovir] Other (See Comments)    Immunization History  Administered Date(s) Administered   Influenza Split 08/25/2015   Influenza, Quadrivalent, Recombinant, Inj, Pf 09/14/2018, 09/12/2019   Influenza-Unspecified 09/08/2017, 08/24/2018   PFIZER(Purple Top)SARS-COV-2 Vaccination 01/28/2020, 02/18/2020, 07/11/2020, 01/28/2021   Pneumococcal Conjugate-13 03/03/2018   Pneumococcal Polysaccharide-23 08/22/2004   Tdap 06/08/2014    Past Medical History:  Diagnosis Date   Pneumonia    Sjogren's syndrome ([place])     Tobacco History: Social History   Tobacco Use  Smoking Status Never  Smokeless Tobacco Never   Counseling given: Not Answered   Outpatient Medications Prior to Visit  Medication Sig Dispense Refill   acyclovir (ZOVIRAX) 400 MG tablet Take 400 mg by mouth 2 (two) times daily.     albuterol (VENTOLIN HFA) 108 (90 Base) MCG/ACT inhaler Inhale 2 puffs into the lungs every 6 (six) hours as needed for wheezing or shortness of breath.     budesonide-formoterol (SYMBICORT) 160-4.5 MCG/ACT inhaler Inhale 2 puffs into the lungs 2 (two) times daily. 1 Inhaler 0   Cetirizine HCl (ZYRTEC ALLERGY) 10 MG CAPS Take by mouth.     cholecalciferol (VITAMIN D) 1000 UNITS tablet Take 1,000 Units by mouth daily.     DULoxetine (CYMBALTA) 60 MG capsule Take 60 mg by mouth daily.     ipratropium (ATROVENT) 0.03 % nasal spray Place 2 sprays into the nose  2 (two) times daily.     meloxicam (MOBIC) 15 MG tablet Take 15 mg by mouth daily.     mycophenolate (CELLCEPT) 500 MG tablet TAKE 2 TABLETS BY MOUTH 2 TIMES DAILY. 120 tablet 4   nebivolol (BYSTOLIC) 2.5 MG tablet Take 2.5 mg by mouth daily.     omeprazole (PRILOSEC) 20 MG capsule TAKE 1 CAPSULE (20 MG TOTAL) BY MOUTH 2 (TWO) TIMES DAILY  BEFORE A MEAL. 180 capsule 0   pravastatin (PRAVACHOL) 10 MG tablet Take 10 mg by mouth once a week.      Probiotic Product (PROBIOTIC-10 PO)      sulfamethoxazole-trimethoprim (BACTRIM DS) 800-160 MG tablet Take 1 tablet by mouth 3 (three) times a week. 12 tablet 5   XIIDRA 5 % SOLN 2 drops 2 (two) times daily.     cyclobenzaprine (FLEXERIL) 10 MG tablet Take 10 mg by mouth daily.     No facility-administered medications prior to visit.     Review of Systems:   Constitutional:   No  weight loss, night sweats,  Fevers, chills, fatigue, or  lassitude.  HEENT:   No headaches,  Difficulty swallowing,  Tooth/dental problems, or  Sore throat,                No sneezing, itching, ear ache, nasal congestion, post nasal drip,   CV:  No chest pain,  Orthopnea, PND, swelling in lower extremities, anasarca, dizziness, palpitations, syncope.   GI  No heartburn, indigestion, abdominal pain, nausea, vomiting, diarrhea, change in bowel habits, loss of appetite, bloody stools.   Resp: No shortness of breath with exertion or at rest.  No excess mucus, no productive cough,  No non-productive cough,  No coughing up of blood.  No change in color of mucus.  No wheezing.  No chest wall deformity  Skin: no rash or lesions.  GU: no dysuria, change in color of urine, no urgency or frequency.  No flank pain, no hematuria   MS:  No joint pain or swelling.  No decreased range of motion.  No back pain.    Physical Exam  BP 124/84   Pulse 79   Ht 5\' 3"  (1.6 m)   Wt 174 lb (78.9 kg)   SpO2 99%   BMI 30.82 kg/m   GEN: A/Ox3; pleasant , NAD, well nourished    HEENT:  [place]/AT,  NOSE-clear, THROAT-clear, no lesions, no postnasal drip or exudate noted.   NECK:  Supple w/ fair ROM; no JVD; normal carotid impulses w/o bruits; no thyromegaly or nodules palpated; no lymphadenopathy.    RESP bibasilar crackles  no accessory muscle use, no dullness to percussion  CARD:  RRR, no m/r/g, no peripheral edema,  pulses intact, no cyanosis or clubbing.  GI:   Soft & nt; nml bowel sounds; no organomegaly or masses detected.   Musco: Warm bil, no deformities or joint swelling noted.   Neuro: alert, no focal deficits noted.    Skin: Warm, no lesions or rashes    Lab Results:  CBC   BMET   BNP No results found for: BNP  ProBNP No results found for: PROBNP  Imaging: No results found.    PFT Results Latest Ref Rng & Units 03/27/2020 05/16/2019 05/13/2018 04/27/2017 07/25/2016 12/20/2015  FVC-Pre L 1.96 1.93 2.36 2.30 - 2.62  FVC-Predicted Pre % 63 61 73 70 69 79  FVC-Post L 2.14 2.08 2.34 2.64 2.47 2.67  FVC-Predicted Post % 69 66 72 81 75 81  Pre FEV1/FVC % % 74 74 73 73 72 72  Post FEV1/FCV % % 76 78 76 78 75 76  FEV1-Pre L 1.45 1.43 1.72 1.68 1.63 1.88  FEV1-Predicted Pre % 60 59 68 66 64 73  FEV1-Post L 1.62 1.62 1.78 2.05 1.86 2.03  DLCO uncorrected ml/min/mmHg 15.02 16.78 16.29 15.89 17.64 18.09  DLCO UNC% % 78 87 71 69 77 78  DLCO corrected ml/min/mmHg 15.02 - - 15.84 17.92 -  DLCO COR %Predicted % 78 - - 69 78 -  DLVA Predicted % 90 105 81 77 86 85  TLC L 5.21 5.15 5.06 4.75 4.82 5.17  TLC % Predicted % 108 107 103 96 98 105  RV % Predicted % 166 159 145 130 134 137    Lab Results  Component Value Date   NITRICOXIDE 111 07/25/2016        Assessment & Plan:   Cough variant asthma Compensated on present regimen  Interstitial lung disease (HCC) Interstitial lung disease/ILP with Sjogren's syndrome.  Clinically she appears to be at her baseline.  She does need follow-up pulmonary function test and high-resolution CT chest. Continue on current regimen from rheumatology with CellCept and  Bactrim (PJP)  Check labs today   Plan  Patient Instructions  HRCT chest in 3 months  Follow up with Dr. Vaughan Browner in 3 months with PFT  Labs today .  Continue on current regimen  Activity as tolerated.       Long-term use of high-risk medication Check labs today       Rexene Edison, NP 05/03/2021

## 2021-05-03 NOTE — Assessment & Plan Note (Signed)
Compensated on present regimen.   

## 2021-05-03 NOTE — Assessment & Plan Note (Signed)
Check labs today.

## 2021-05-03 NOTE — Patient Instructions (Signed)
HRCT chest in 3 months  Follow up with Dr. Vaughan Browner in 3 months with PFT  Labs today .  Continue on current regimen  Activity as tolerated.

## 2021-05-03 NOTE — Assessment & Plan Note (Signed)
Interstitial lung disease/ILP with Sjogren's syndrome.  Clinically she appears to be at her baseline.  She does need follow-up pulmonary function test and high-resolution CT chest. Continue on current regimen from rheumatology with CellCept and  Bactrim (PJP)  Check labs today   Plan  Patient Instructions  HRCT chest in 3 months  Follow up with Dr. Vaughan Browner in 3 months with PFT  Labs today .  Continue on current regimen  Activity as tolerated.

## 2021-05-06 ENCOUNTER — Ambulatory Visit: Payer: Self-pay | Admitting: Adult Health

## 2021-06-28 IMAGING — CT CT CHEST HIGH RESOLUTION W/O CM
2 of 7 series · 14 of 36 positions shown, 17 images · non-contrast
Comparison: 05/31/2019 high-resolution chest CT.

CLINICAL DATA: Catanzaro disease. Follow-up interstitial lung
disease. Worsening dyspnea with exertion.

EXAM:
CT CHEST WITHOUT CONTRAST
TECHNIQUE: Multidetector CT imaging of the chest was performed following the
standard protocol without intravenous contrast. High resolution
imaging of the lungs, as well as inspiratory and expiratory imaging,
was performed.

[Series 4: high resolution · axial · 0.66mm/px · z∈[-290,-64]mm · 11 of 272 slices shown, 14 images]
[im 23/272  mediastinal]
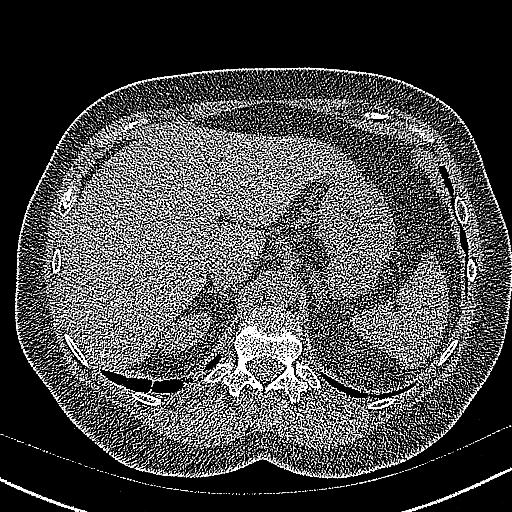
[im 23/272  lung]
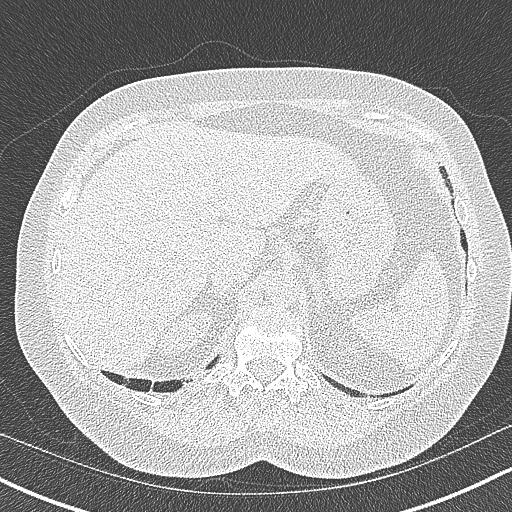
[im 46/272  lung]
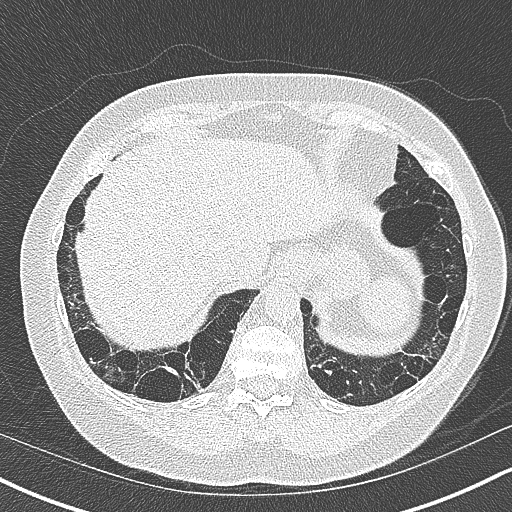
[im 68/272  lung]
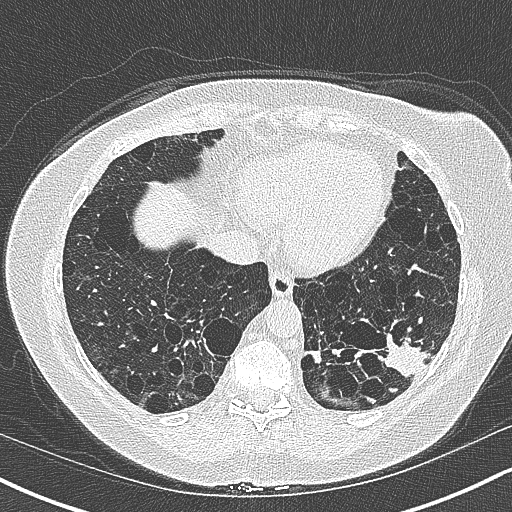
[im 91/272  lung]
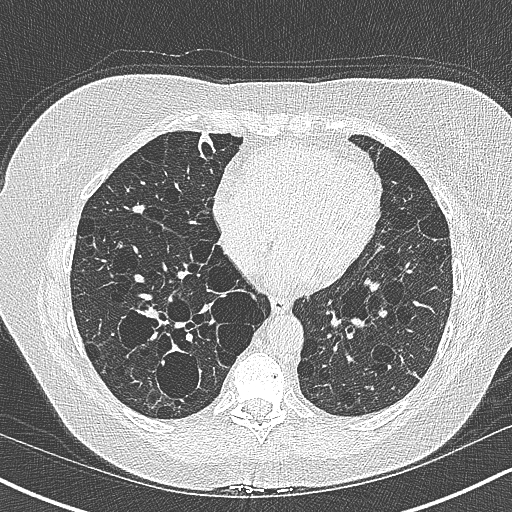
[im 113/272  mediastinal]
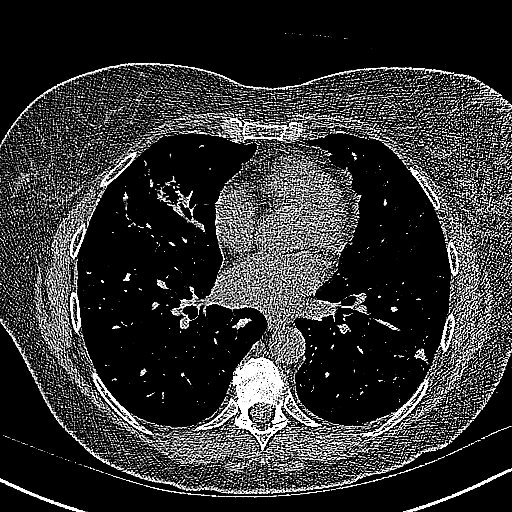
[im 113/272  lung]
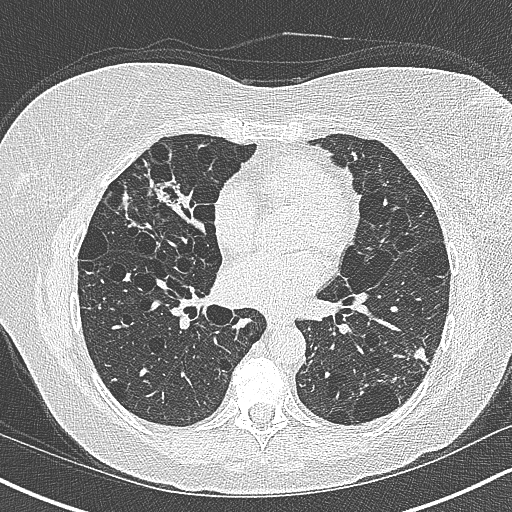
[im 136/272  lung]
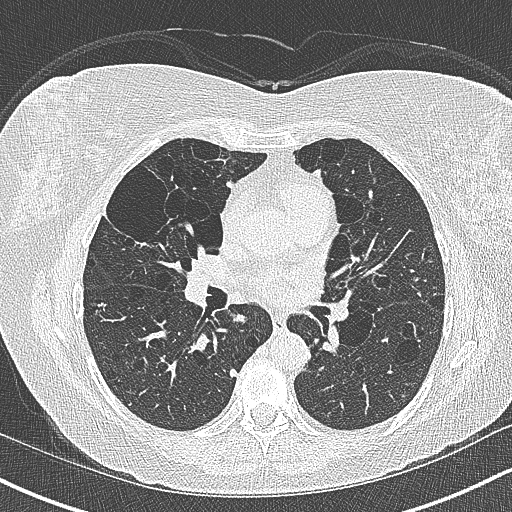
[im 159/272  lung]
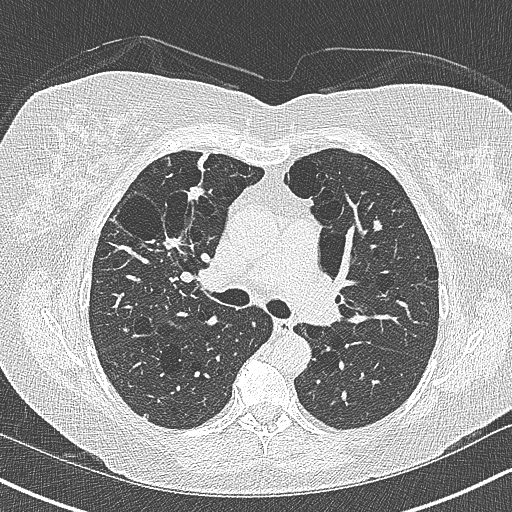
[im 181/272  lung]
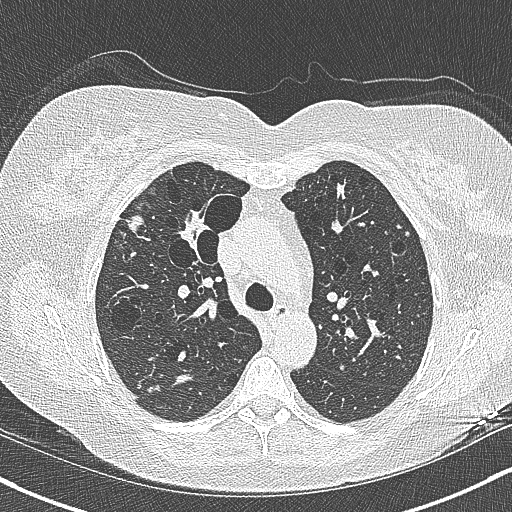
[im 204/272  mediastinal]
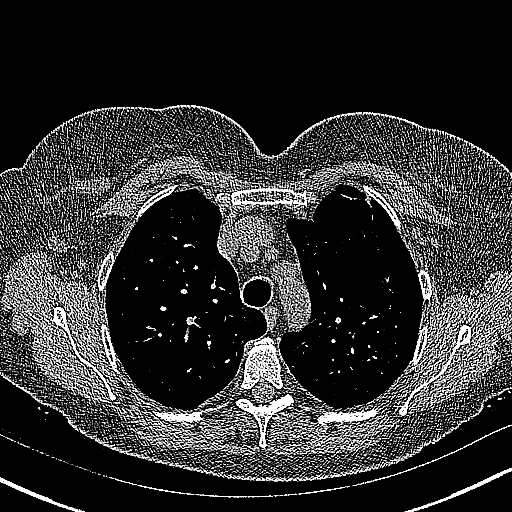
[im 204/272  lung]
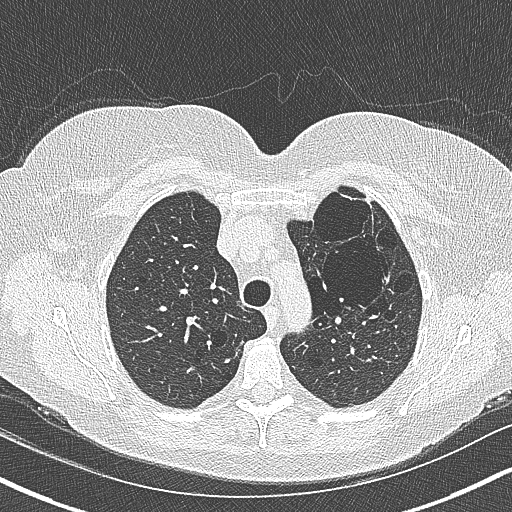
[im 226/272  lung]
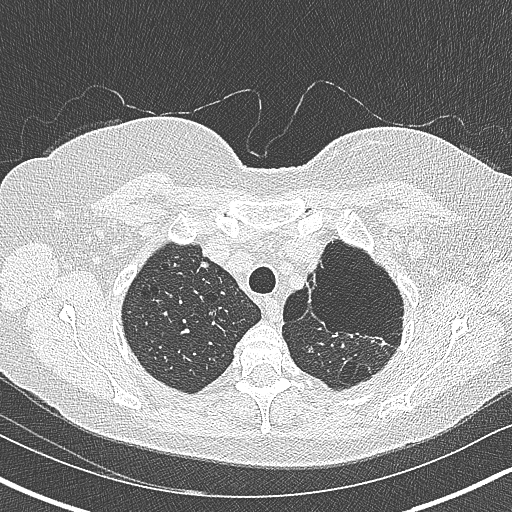
[im 249/272  lung]
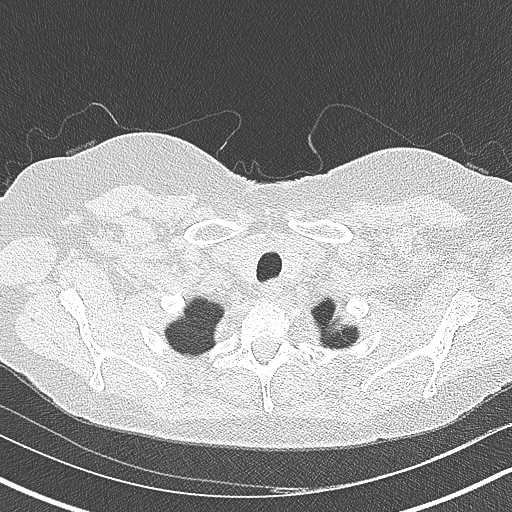

[Series 6: coronal · coronal · 0.59mm/px · 3 of 124 slices shown]
[im 25/124  lung]
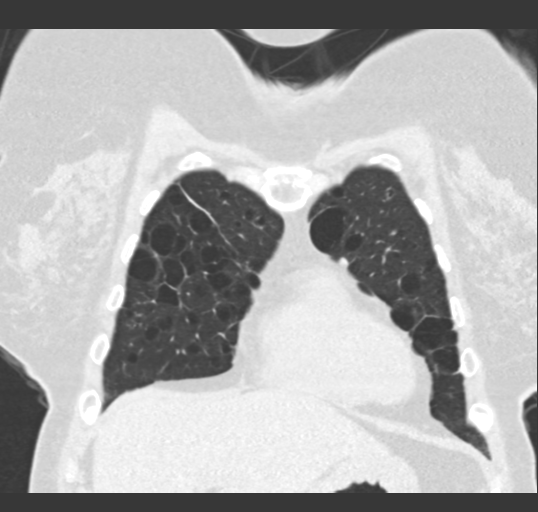
[im 50/124  lung]
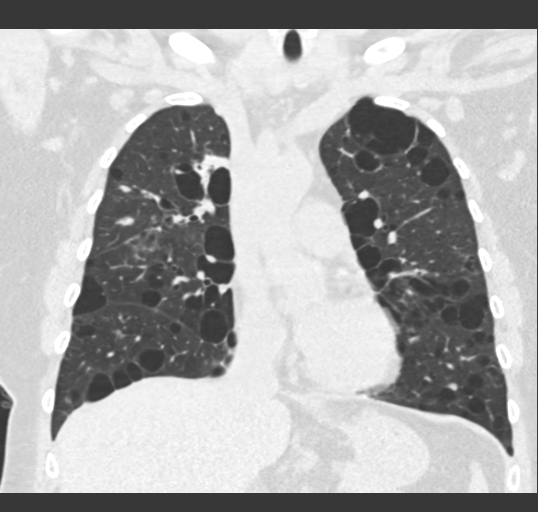
[im 74/124  lung]
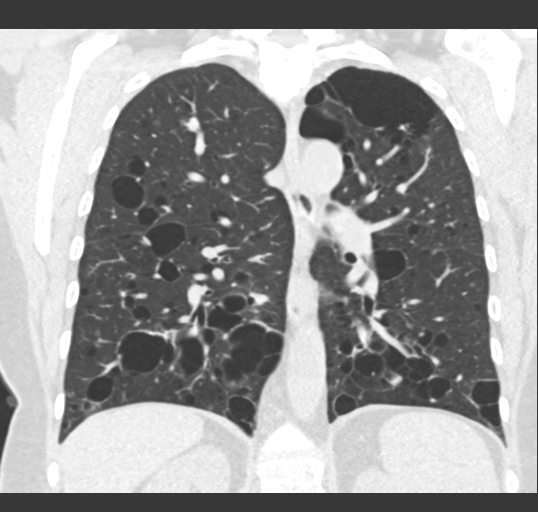

[14 of 36 positions shown; findings below may reference images not displayed]

FINDINGS: Cardiovascular: Normal heart size. No significant pericardial
effusion/thickening. Great vessels are normal in course and caliber.

Mediastinum/Nodes: No discrete thyroid nodules. Unremarkable
esophagus. Shoddy subcentimeter bilateral axillary lymph nodes are
unchanged. No pathologically enlarged mediastinal or discrete hilar
nodes on this noncontrast scan.

Lungs/Pleura: No pneumothorax. No pleural effusion. No acute
consolidative airspace disease. Innumerable thin-walled air cysts
and irregular solid pulmonary nodules of varying size scattered
throughout both lungs, with the pulmonary nodules located in a
perilymphatic distribution with subpleural, perifissural and
peribronchovascular involvement. No significant regions traction
bronchiectasis or frank honeycombing. The pulmonary air cysts appear
mildly increased. Representative 2.6 cm peripheral right upper lobe
air cyst (series 3/image 49), previously 2.4 cm. Dominant apical
left upper lobe 7.6 cm air cyst (series 3/image 25), previously
cm. The irregular nodules appear stable. Representative 3.7 cm
irregular anterior right upper lobe nodule (series 3/image 50),
previously 3.7 cm. Representative 3.6 cm regular peripheral left
lower lobe nodule (series 3/image 102), previously 3.6 cm.
Representative 1.7 cm anterior right upper lobe nodule (series
3/image 95), previously 1.7 cm. No new significant pulmonary
nodules. No significant lobular air trapping on the expiration
sequence.

Upper abdomen: Right adrenal 1.4 cm nodule with density 14 HU,
stable in size, compatible with a benign adenoma.

Musculoskeletal: No aggressive appearing focal osseous lesions.
Moderate thoracic spondylosis.
IMPRESSION: 1. Innumerable thin-walled air cysts and irregular solid
perilymphatic distribution pulmonary nodules throughout both lungs,
compatible with lymphoid interstitial pneumonia (LIP) in this
patient with known Catanzaro disease. The air cysts have mildly
progressed since 05/31/2019 chest CT. The nodules are stable.
Findings are suggestive of an alternative diagnosis (not UIP) per
consensus guidelines: Diagnosis of Idiopathic Pulmonary Fibrosis: An
Official ATS/ERS/JRS/ALAT Clinical Practice Guideline. Am J Respir
Crit Care Med Vol 198, [place] 5, ppe44-[id number][DATE].
2. Stable right adrenal adenoma.
3. Aortic Atherosclerosis ([id number]).

## 2021-08-02 ENCOUNTER — Other Ambulatory Visit: Payer: Self-pay

## 2021-08-02 ENCOUNTER — Ambulatory Visit (INDEPENDENT_AMBULATORY_CARE_PROVIDER_SITE_OTHER)
Admission: RE | Admit: 2021-08-02 | Discharge: 2021-08-02 | Disposition: A | Discharge status: Home / Self Care | Payer: BC Managed Care – PPO | Source: Ambulatory Visit | Attending: Adult Health | Admitting: Adult Health

## 2021-08-02 DIAGNOSIS — J849 Interstitial pulmonary disease, unspecified: Secondary | ICD-10-CM | POA: Diagnosis not present

## 2021-08-02 DIAGNOSIS — J842 Lymphoid interstitial pneumonia: Secondary | ICD-10-CM | POA: Diagnosis not present

## 2021-08-07 NOTE — Progress Notes (Signed)
ATC x1.  LM to return call. 

## 2021-08-08 ENCOUNTER — Telehealth: Payer: Self-pay | Admitting: Pulmonary Disease

## 2021-08-08 NOTE — Telephone Encounter (Signed)
Call made to patient, confirmed DOB. Made aware of CT results per TP. Confirmed upcoming appt.   Nothing further needed at this time.

## 2021-08-14 NOTE — Progress Notes (Signed)
Called and spoke with patient, advised of results/recommendations per Rexene Edison NP.  She verbalized understanding.  She requested to have a copy of the CT scan sent to her Rheumatologist.  He is requesting a copy.  Routed to rheumatologist. Nothing further needed.

## 2021-08-19 ENCOUNTER — Ambulatory Visit: Payer: BC Managed Care – PPO | Admitting: Pulmonary Disease

## 2021-08-27 ENCOUNTER — Other Ambulatory Visit: Payer: Self-pay

## 2021-08-27 ENCOUNTER — Ambulatory Visit: Payer: BC Managed Care – PPO | Admitting: Pulmonary Disease

## 2021-08-27 ENCOUNTER — Ambulatory Visit (INDEPENDENT_AMBULATORY_CARE_PROVIDER_SITE_OTHER): Payer: BC Managed Care – PPO | Admitting: Pulmonary Disease

## 2021-08-27 DIAGNOSIS — J842 Lymphoid interstitial pneumonia: Secondary | ICD-10-CM

## 2021-08-27 DIAGNOSIS — J849 Interstitial pulmonary disease, unspecified: Secondary | ICD-10-CM

## 2021-08-27 LAB — PULMONARY FUNCTION TEST
DL/VA % pred: 87 %
DL/VA: 3.74 ml/min/mmHg/L
DLCO cor % pred: 80 %
DLCO cor: 15.2 ml/min/mmHg
DLCO unc % pred: 80 %
DLCO unc: 15.2 ml/min/mmHg
FEF 25-75 Post: 1.22 L/sec
FEF 25-75 Pre: 1.07 L/sec
FEF2575-%Change-Post: 13 %
FEF2575-%Pred-Post: 55 %
FEF2575-%Pred-Pre: 48 %
FEV1-%Change-Post: 3 %
FEV1-%Pred-Post: 61 %
FEV1-%Pred-Pre: 59 %
FEV1-Post: 1.46 L
FEV1-Pre: 1.41 L
FEV1FVC-%Change-Post: 5 %
FEV1FVC-%Pred-Pre: 95 %
FEV6-%Change-Post: 0 %
FEV6-%Pred-Post: 62 %
FEV6-%Pred-Pre: 62 %
FEV6-Post: 1.86 L
FEV6-Pre: 1.86 L
FEV6FVC-%Pred-Post: 103 %
FEV6FVC-%Pred-Pre: 103 %
FVC-%Change-Post: -1 %
FVC-%Pred-Post: 60 %
FVC-%Pred-Pre: 61 %
FVC-Post: 1.86 L
FVC-Pre: 1.89 L
Post FEV1/FVC ratio: 79 %
Post FEV6/FVC ratio: 100 %
Pre FEV1/FVC ratio: 74 %
Pre FEV6/FVC Ratio: 100 %
RV % pred: 140 %
RV: 2.69 L
TLC % pred: 105 %
TLC: 5.03 L

## 2021-08-27 NOTE — Progress Notes (Signed)
Full PFT performed today. °

## 2021-08-27 NOTE — Patient Instructions (Signed)
Full PFT performed today. °

## 2021-08-28 ENCOUNTER — Ambulatory Visit: Payer: BC Managed Care – PPO | Admitting: Pulmonary Disease

## 2021-08-28 ENCOUNTER — Encounter: Payer: Self-pay | Admitting: Pulmonary Disease

## 2021-08-28 VITALS — BP 140/78 | HR 84 | Temp 98.3°F | Ht 63.0 in | Wt 173.4 lb

## 2021-08-28 DIAGNOSIS — J842 Lymphoid interstitial pneumonia: Secondary | ICD-10-CM

## 2021-08-28 DIAGNOSIS — J849 Interstitial pulmonary disease, unspecified: Secondary | ICD-10-CM

## 2021-08-28 DIAGNOSIS — Z5181 Encounter for therapeutic drug level monitoring: Secondary | ICD-10-CM | POA: Diagnosis not present

## 2021-08-28 DIAGNOSIS — R0602 Shortness of breath: Secondary | ICD-10-CM

## 2021-08-28 LAB — CBC WITH DIFFERENTIAL/PLATELET
Basophils Absolute: 0 10*3/uL (ref 0.0–0.1)
Basophils Relative: 1.2 % (ref 0.0–3.0)
Eosinophils Absolute: 0.1 10*3/uL (ref 0.0–0.7)
Eosinophils Relative: 2.8 % (ref 0.0–5.0)
HCT: 36.3 % (ref 36.0–46.0)
Hemoglobin: 12 g/dL (ref 12.0–15.0)
Lymphocytes Relative: 19.5 % (ref 12.0–46.0)
Lymphs Abs: 0.8 10*3/uL (ref 0.7–4.0)
MCHC: 33 g/dL (ref 30.0–36.0)
MCV: 89.7 fl (ref 78.0–100.0)
Monocytes Absolute: 0.5 10*3/uL (ref 0.1–1.0)
Monocytes Relative: 11.4 % (ref 3.0–12.0)
Neutro Abs: 2.6 10*3/uL (ref 1.4–7.7)
Neutrophils Relative %: 65.1 % (ref 43.0–77.0)
Platelets: 364 10*3/uL (ref 150.0–400.0)
RBC: 4.05 Mil/uL (ref 3.87–5.11)
RDW: 14.2 % (ref 11.5–15.5)
WBC: 4.1 10*3/uL (ref 4.0–10.5)

## 2021-08-28 LAB — COMPREHENSIVE METABOLIC PANEL
ALT: 12 U/L (ref 0–35)
AST: 16 U/L (ref 0–37)
Albumin: 4.3 g/dL (ref 3.5–5.2)
Alkaline Phosphatase: 61 U/L (ref 39–117)
BUN: 15 mg/dL (ref 6–23)
CO2: 25 mEq/L (ref 19–32)
Calcium: 9.7 mg/dL (ref 8.4–10.5)
Chloride: 103 mEq/L (ref 96–112)
Creatinine, Ser: 0.78 mg/dL (ref 0.40–1.20)
GFR: 81.68 mL/min (ref >60.00)
Glucose, Bld: 92 mg/dL (ref 70–99)
Potassium: 4.6 mEq/L (ref 3.5–5.1)
Sodium: 135 mEq/L (ref 135–145)
Total Bilirubin: 0.5 mg/dL (ref 0.2–1.2)
Total Protein: 7.7 g/dL (ref 6.0–8.3)

## 2021-08-28 NOTE — Patient Instructions (Signed)
I have reviewed your CT scan and lung function test which is stable Continue current treatment We will check a CBC, CMP and proBNP for dyspnea today for monitoring Follow-up in 6 months

## 2021-08-28 NOTE — Progress Notes (Signed)
Donna Fields    [id number]    1961-02-13  Primary Care Physician:Kim, Jeneen Rinks, MD  Referring Physician: [name], MD [place],   [id number]  Problem list:   Interstitial lung disease, LIP Sjogren's syndrome On Imuran since 2019, switched to Cellcept July 2353  HPI: 60 year old with history of Sjogren's syndrome.  This was diagnosed in her 59s Noted to have interstitial lung disease consistent with likely about 4 years ago. Started on Imuran 2 years ago by Dr. Kathlene November, rheumatology CT scan and PFTs show progressive worsening over the years and has been referred for help with management  Chief complaint is dyspnea on exertion, occasional dizziness.  No cough, sputum production.  Denies any lower extremity weakness. She notes that her heart rate goes up with minimal exertion and stays elevated for prolonged.  Pets: Cats.  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 Exposures: No mold, hot tub, Jacuzzi.  No feather pillows or comforters Smoking history: Never smoker Travel history: No significant travel Relevant family history: No significant family history of lung disease  Interim sick: Switched to [employer] this dose well with no issues.  States his breathing is doing well.  Outpatient Encounter Medications as of 08/28/2021  Medication Sig   acyclovir (ZOVIRAX) 400 MG tablet Take 400 mg by mouth 2 (two) times daily.   albuterol (VENTOLIN HFA) 108 (90 Base) MCG/ACT inhaler Inhale 2 puffs into the lungs every 6 (six) hours as needed for wheezing or shortness of breath.   budesonide-formoterol (SYMBICORT) 160-4.5 MCG/ACT inhaler Inhale 2 puffs into the lungs 2 (two) times daily.   Cetirizine HCl (ZYRTEC ALLERGY) 10 MG CAPS Take by mouth.   cholecalciferol (VITAMIN D) 1000 UNITS tablet Take 1,000 Units by mouth daily.    DULoxetine (CYMBALTA) 60 MG capsule Take 60 mg by mouth daily.   ipratropium (ATROVENT) 0.03 % nasal spray Place 2 sprays into the nose 2 (two) times daily.   meloxicam (MOBIC) 15 MG tablet Take 15 mg by mouth daily.   mycophenolate (CELLCEPT) 500 MG tablet TAKE 2 TABLETS BY MOUTH 2 TIMES DAILY.   nebivolol (BYSTOLIC) 2.5 MG tablet Take 2.5 mg by mouth daily.   omeprazole (PRILOSEC) 20 MG capsule TAKE 1 CAPSULE (20 MG TOTAL) BY MOUTH 2 (TWO) TIMES DAILY BEFORE A MEAL.   pravastatin (PRAVACHOL) 10 MG tablet Take 10 mg by mouth every other day.   Probiotic Product (PROBIOTIC-10 PO)    sulfamethoxazole-trimethoprim (BACTRIM DS) 800-160 MG tablet Take 1 tablet by mouth 3 (three) times a week.   XIIDRA 5 % SOLN 2 drops 2 (two) times daily.   No facility-administered encounter medications on file as of 08/28/2021.   Physical Exam: Blood pressure 128/78, pulse 94, temperature 97.9 F (36.6 C), temperature source Other (Comment), height 5\' 3"  (1.6 m), weight 177 lb 6.4 oz (80.5 kg), SpO2 98 %. Gen:      No acute distress HEENT:  EOMI, sclera anicteric Neck:     No masses; no thyromegaly Lungs:    Clear to auscultation bilaterally; normal respiratory effort CV:         Regular rate and rhythm; no murmurs Abd:      + bowel sounds; soft, non-tender; no palpable masses, no distension Ext:    No edema; adequate peripheral perfusion Skin:  Warm and dry; no rash Neuro: alert and oriented x 3 Psych: normal mood and affect  Data Reviewed: Imaging: High-res CT 05/31/2019-numerous thin-walled pulmonary cysts with basal predominance, superimposed nodular and masslike pulmonary opacities worsening compared to 2019  High-res CT 03/23/2020-slight worsening of pulmonary cysts.  Stable lung nodules.  High-res CT 08/02/2021-redemonstrated cystic changes, pulmonary nodules.  Decreased density of right upper lobe pulmonary nodule I have reviewed the images personally.  PFTs: 03/27/2020 FVC 2.14 [69%], FEV1  1.62 [67%], F/F 76, TLC 5.21 [108%], DLCO 15.02 [78%] Minimal obstruction, minimal diffusion defect.  08/27/2021 FVC 1.86 [60%], FEV1 1.46 [61%], F/F 79, TLC 5.03 [25%], DLCO 15.20 [80%] Normal test.  Labs: CMP 05/23/2020-within normal limits CBC 05/23/2020-WBC 3.1, hemoglobin 11.6  Cardiac: Echocardiogram 06/15/2020-LVEF 60-65%, RV size and function is normal with trivial tricuspid regurgitation, TAPSE 2.2 cm  Assessment:  LIP secondary to Sjogren's syndrome CT scan is characteristic of LIP secondary to Sjogren's syndrome.  Although PFTs are stable she has progression of symptoms and CT findings on Imuran. After discussion with her rheumatologist we switched her to CellCept in 2021 She is tolerating the dose well.  Echo reviewed with no pulmonary hypertension.  Repeat CT and PFTs reviewed which shows stability. Check baseline labs today including CMP, CBC, check proBNP Continue Bactrim prophylaxis  Plan/Recommendations: Continue CellCept, Bactrim Check CBC, CMP, proBNP for monitoring  Marshell Garfinkel MD [facility] 08/28/2021, 12:02 PM  CC: [name], MD

## 2021-08-29 LAB — PRO B NATRIURETIC PEPTIDE: NT-Pro BNP: 56 pg/mL (ref 0–287)

## 2021-10-22 ENCOUNTER — Other Ambulatory Visit: Payer: Self-pay | Admitting: Pulmonary Disease

## 2021-10-22 DIAGNOSIS — J849 Interstitial pulmonary disease, unspecified: Secondary | ICD-10-CM

## 2021-11-26 ENCOUNTER — Other Ambulatory Visit: Payer: Self-pay | Admitting: Internal Medicine

## 2021-11-26 DIAGNOSIS — Z1231 Encounter for screening mammogram for malignant neoplasm of breast: Secondary | ICD-10-CM

## 2021-12-11 ENCOUNTER — Ambulatory Visit
Admission: RE | Admit: 2021-12-11 | Discharge: 2021-12-11 | Disposition: A | Discharge status: Home / Self Care | Payer: BC Managed Care – PPO | Source: Ambulatory Visit

## 2021-12-11 ENCOUNTER — Other Ambulatory Visit: Payer: Self-pay

## 2021-12-11 DIAGNOSIS — Z1231 Encounter for screening mammogram for malignant neoplasm of breast: Secondary | ICD-10-CM

## 2021-12-12 ENCOUNTER — Other Ambulatory Visit: Payer: Self-pay | Admitting: Internal Medicine

## 2021-12-12 DIAGNOSIS — R928 Other abnormal and inconclusive findings on diagnostic imaging of breast: Secondary | ICD-10-CM

## 2021-12-23 ENCOUNTER — Other Ambulatory Visit: Payer: Self-pay | Admitting: Pulmonary Disease

## 2021-12-23 DIAGNOSIS — J842 Lymphoid interstitial pneumonia: Secondary | ICD-10-CM

## 2022-01-01 ENCOUNTER — Ambulatory Visit: Payer: BC Managed Care – PPO

## 2022-01-01 ENCOUNTER — Ambulatory Visit
Admission: RE | Admit: 2022-01-01 | Discharge: 2022-01-01 | Disposition: A | Discharge status: Home / Self Care | Payer: BC Managed Care – PPO | Source: Ambulatory Visit | Attending: Internal Medicine | Admitting: Internal Medicine

## 2022-01-01 DIAGNOSIS — R928 Other abnormal and inconclusive findings on diagnostic imaging of breast: Secondary | ICD-10-CM

## 2022-05-11 ENCOUNTER — Other Ambulatory Visit: Payer: Self-pay | Admitting: Pulmonary Disease

## 2022-05-11 DIAGNOSIS — J849 Interstitial pulmonary disease, unspecified: Secondary | ICD-10-CM

## 2022-06-09 ENCOUNTER — Other Ambulatory Visit: Payer: Self-pay | Admitting: Pulmonary Disease

## 2022-06-09 DIAGNOSIS — J842 Lymphoid interstitial pneumonia: Secondary | ICD-10-CM

## 2022-09-26 ENCOUNTER — Encounter: Payer: Self-pay | Admitting: Pulmonary Disease

## 2022-09-26 ENCOUNTER — Ambulatory Visit: Payer: BC Managed Care – PPO | Admitting: Pulmonary Disease

## 2022-09-26 VITALS — BP 136/72 | HR 78 | Temp 98.2°F | Ht 63.0 in | Wt 182.0 lb

## 2022-09-26 DIAGNOSIS — J842 Lymphoid interstitial pneumonia: Secondary | ICD-10-CM | POA: Diagnosis not present

## 2022-09-26 DIAGNOSIS — J849 Interstitial pulmonary disease, unspecified: Secondary | ICD-10-CM | POA: Diagnosis not present

## 2022-09-26 NOTE — Progress Notes (Signed)
Donna Fields    [id number]    06/07/1961  Primary Care Physician:Kim, Jeneen Rinks, MD  Referring Physician: [name], MD [place] [place],  [place] [id number]  Problem list:   Interstitial lung disease, LIP Sjogren's syndrome On Imuran since 2019, switched to Cellcept July 2021  HPI: 61 y.o. with history of Sjogren's syndrome.  This was diagnosed in her 36s Noted to have interstitial lung disease consistent with likely about 4 years ago. Started on Imuran in 2019 by Dr. Kathlene November, rheumatology CT scan and PFTs show progressive worsening over the years and has been referred for help with management Switch to CellCept in July 2021  Chief complaint is dyspnea on exertion, occasional dizziness.  No cough, sputum production.  Denies any lower extremity weakness. She notes that her heart rate goes up with minimal exertion and stays elevated for prolonged.  Pets: Cats.  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 Exposures: No mold, hot tub, Jacuzzi.  No feather pillows or comforters Smoking history: Never smoker Travel history: No significant travel Relevant family history: No significant family history of lung disease  Interim sick: Continues on CellCept without any issue.  Her labs and dose are being monitored by Dr. Kathlene November from rheumatology States that breathing is stable with no issues.  Outpatient Encounter Medications as of 09/26/2022  Medication Sig   acyclovir (ZOVIRAX) 400 MG tablet Take 400 mg by mouth 2 (two) times daily.   albuterol (VENTOLIN HFA) 108 (90 Base) MCG/ACT inhaler Inhale 2 puffs into the lungs every 6 (six) hours as needed for wheezing or shortness of breath.   budesonide-formoterol (SYMBICORT) 160-4.5 MCG/ACT inhaler Inhale 2 puffs into the lungs 2 (two) times daily.   Cetirizine HCl (ZYRTEC ALLERGY) 10 MG CAPS Take by  mouth.   cholecalciferol (VITAMIN D) 1000 UNITS tablet Take 1,000 Units by mouth daily.   DULoxetine (CYMBALTA) 60 MG capsule Take 60 mg by mouth daily.   ipratropium (ATROVENT) 0.03 % nasal spray Place 2 sprays into the nose 2 (two) times daily.   meloxicam (MOBIC) 15 MG tablet Take 15 mg by mouth daily.   mycophenolate (CELLCEPT) 500 MG tablet TAKE 2 TABLETS BY MOUTH 2 TIMES DAILY.   nebivolol (BYSTOLIC) 2.5 MG tablet Take 2.5 mg by mouth daily.   omeprazole (PRILOSEC) 20 MG capsule TAKE 1 CAPSULE (20 MG TOTAL) BY MOUTH 2 (TWO) TIMES DAILY BEFORE A MEAL.   pravastatin (PRAVACHOL) 10 MG tablet Take 10 mg by mouth every other day.   predniSONE (DELTASONE) 20 MG tablet 2 tablets Orally Once a day for 3 days   Probiotic Product (PROBIOTIC-10 PO)    sulfamethoxazole-trimethoprim (BACTRIM DS) 800-160 MG tablet TAKE 1 TABLET BY MOUTH THREE TIMES A WEEK.   XIIDRA 5 % SOLN 2 drops 2 (two) times daily.   No facility-administered encounter medications on file as of 09/26/2022.   Physical Exam: Blood pressure 136/72, pulse 78, temperature 98.2 F (36.8 C), temperature source Oral, height '5\' 3"'$  (1.6 m), weight 182 lb (82.6 kg), SpO2 96 %. Gen:      No acute distress HEENT:  EOMI, sclera anicteric Neck:     No masses; no thyromegaly Lungs:    Clear to auscultation bilaterally; normal respiratory effort CV:         Regular rate and rhythm; no murmurs Abd:      +  bowel sounds; soft, non-tender; no palpable masses, no distension Ext:    No edema; adequate peripheral perfusion Skin:      Warm and dry; no rash Neuro: alert and oriented x 3 Psych: normal mood and affect   Data Reviewed: Imaging: High-res CT 05/31/2019-numerous thin-walled pulmonary cysts with basal predominance, superimposed nodular and masslike pulmonary opacities worsening compared to 2019  High-res CT 03/23/2020-slight worsening of pulmonary cysts.  Stable lung nodules.  High-res CT 08/02/2021-redemonstrated cystic changes, pulmonary  nodules.  Decreased density of right upper lobe pulmonary nodule I have reviewed the images personally.  PFTs: 03/27/2020 FVC 2.14 [69%], FEV1 1.62 [67%], F/F 76, TLC 5.21 [108%], DLCO 15.02 [78%] Minimal obstruction, minimal diffusion defect.  08/27/2021 FVC 1.86 [60%], FEV1 1.46 [61%], F/F 79, TLC 5.03 [25%], DLCO 15.20 [80%] Normal test.  Labs: CMP 05/23/2020-within normal limits CBC 05/23/2020-WBC 3.1, hemoglobin 11.6  Cardiac: Echocardiogram 06/15/2020-LVEF 60-65%, RV size and function is normal with trivial tricuspid regurgitation, TAPSE 2.2 cm  Assessment:  LIP secondary to Sjogren's syndrome CT scan is characteristic of LIP secondary to Sjogren's syndrome.  Although PFTs are stable she has progression of symptoms and CT findings on Imuran. After discussion with her rheumatologist we switched her to CellCept in 2021 She is tolerating the dose well.  Echo reviewed with no pulmonary hypertension.  Repeat CT and PFTs to reevaluate ILD.  Plan/Recommendations: Continue CellCept, Bactrim High-res CT and PFTs in 3-4 months  Marshell Garfinkel MD [facility] 09/26/2022, 1:57 PM  CC: [name], MD

## 2022-09-26 NOTE — Addendum Note (Signed)
Addended by: Elton Sin on: 09/26/2022 02:10 PM   Modules accepted: Orders

## 2022-09-26 NOTE — Patient Instructions (Signed)
I am glad you are stable with your breathing Will order high-res CT and PFTs in 3 to 4 months Return to clinic after these tests.

## 2022-12-02 ENCOUNTER — Other Ambulatory Visit: Payer: Self-pay | Admitting: Internal Medicine

## 2022-12-02 DIAGNOSIS — Z1231 Encounter for screening mammogram for malignant neoplasm of breast: Secondary | ICD-10-CM

## 2022-12-31 ENCOUNTER — Ambulatory Visit
Admission: RE | Admit: 2022-12-31 | Discharge: 2022-12-31 | Disposition: A | Discharge status: Home / Self Care | Payer: BC Managed Care – PPO | Source: Ambulatory Visit | Attending: Pulmonary Disease | Admitting: Pulmonary Disease

## 2022-12-31 DIAGNOSIS — J849 Interstitial pulmonary disease, unspecified: Secondary | ICD-10-CM

## 2023-01-14 ENCOUNTER — Other Ambulatory Visit: Payer: Self-pay | Admitting: Pulmonary Disease

## 2023-01-14 DIAGNOSIS — J842 Lymphoid interstitial pneumonia: Secondary | ICD-10-CM

## 2023-01-26 ENCOUNTER — Ambulatory Visit
Admission: RE | Admit: 2023-01-26 | Discharge: 2023-01-26 | Disposition: A | Discharge status: Home / Self Care | Payer: BC Managed Care – PPO | Source: Ambulatory Visit | Attending: Internal Medicine | Admitting: Internal Medicine

## 2023-01-26 DIAGNOSIS — Z1231 Encounter for screening mammogram for malignant neoplasm of breast: Secondary | ICD-10-CM

## 2023-03-20 ENCOUNTER — Ambulatory Visit (INDEPENDENT_AMBULATORY_CARE_PROVIDER_SITE_OTHER): Payer: BC Managed Care – PPO | Admitting: Pulmonary Disease

## 2023-03-20 ENCOUNTER — Encounter: Payer: Self-pay | Admitting: Pulmonary Disease

## 2023-03-20 VITALS — BP 118/60 | HR 91 | Temp 98.2°F | Ht 63.0 in | Wt 180.8 lb

## 2023-03-20 DIAGNOSIS — J842 Lymphoid interstitial pneumonia: Secondary | ICD-10-CM | POA: Diagnosis not present

## 2023-03-20 DIAGNOSIS — J849 Interstitial pulmonary disease, unspecified: Secondary | ICD-10-CM

## 2023-03-20 LAB — PULMONARY FUNCTION TEST
DL/VA % pred: 89 %
DL/VA: 3.77 ml/min/mmHg/L
DLCO cor % pred: 72 %
DLCO cor: 14.02 ml/min/mmHg
DLCO unc % pred: 72 %
DLCO unc: 14.02 ml/min/mmHg
FEF 25-75 Post: 1.05 L/sec
FEF 25-75 Pre: 0.71 L/sec
FEF2575-%Change-Post: 47 %
FEF2575-%Pred-Post: 48 %
FEF2575-%Pred-Pre: 32 %
FEV1-%Change-Post: 12 %
FEV1-%Pred-Post: 53 %
FEV1-%Pred-Pre: 47 %
FEV1-Post: 1.28 L
FEV1-Pre: 1.14 L
FEV1FVC-%Change-Post: 5 %
FEV1FVC-%Pred-Pre: 88 %
FEV6-%Change-Post: 6 %
FEV6-%Pred-Post: 59 %
FEV6-%Pred-Pre: 55 %
FEV6-Post: 1.77 L
FEV6-Pre: 1.66 L
FEV6FVC-%Pred-Post: 103 %
FEV6FVC-%Pred-Pre: 103 %
FVC-%Change-Post: 6 %
FVC-%Pred-Post: 57 %
FVC-%Pred-Pre: 53 %
FVC-Post: 1.77 L
FVC-Pre: 1.66 L
Post FEV1/FVC ratio: 72 %
Post FEV6/FVC ratio: 100 %
Pre FEV1/FVC ratio: 69 %
Pre FEV6/FVC Ratio: 100 %
RV % pred: 185 %
RV: 3.7 L
TLC % pred: 111 %
TLC: 5.48 L

## 2023-03-20 MED ORDER — BUDESONIDE-FORMOTEROL FUMARATE 160-4.5 MCG/ACT IN AERO: 2.0000 | INHALATION_SPRAY | Freq: Two times a day (BID) | RESPIRATORY_TRACT | 11 refills | Status: DC

## 2023-03-20 NOTE — Patient Instructions (Addendum)
I am glad that your breathing is stable Your CT looks stable which is good news Your PFTs do show some radiation but will continue to monitor Order high-res CT and PFTs in 1 year Return to clinic in 1 year after these tests

## 2023-03-20 NOTE — Addendum Note (Signed)
Addended by: Jacquiline Doe on: 03/20/2023 11:27 AM   Modules accepted: Orders

## 2023-03-20 NOTE — Progress Notes (Addendum)
 Donna Fields    [id number]    [birth year]  Primary Care Physician:Kim, Royston Cornea, MD  Referring Physician: Vanita Gens, MD [address] [place],  [place] [address]  Problem list:   Interstitial lung disease, LIP Sjogren's syndrome On Imuran since 2019, switched to Cellcept  July 2021  HPI: 62 y.o. with history of Sjogren's syndrome.  This was diagnosed in her 30s Noted to have interstitial lung disease consistent with likely about 4 years ago. Started on Imuran in 2019 by Dr. Bascom Lily, rheumatology CT scan and PFTs show progressive worsening over the years and has been referred for help with management Switch to CellCept  in July 2021  Chief complaint is dyspnea on exertion, occasional dizziness.  No cough, sputum production.  Denies any lower extremity weakness. She notes that her heart rate goes up with minimal exertion and stays elevated for prolonged.  Pets: Cats.  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 Exposures: No mold, hot tub, Jacuzzi.  No feather pillows or comforters Smoking history: Never smoker Travel history: No significant travel Relevant family history: No significant family history of lung disease  Interim history: Continues on CellCept  without any issue.  Her labs and dose are being monitored by Dr. Bascom Lily from rheumatology Breathing is stable with no issues Here for review of CT and PFTs that she is got this year. Off Bactrim  as she cannot tolerate the medication.  Outpatient Encounter Medications as of 03/20/2023  Medication Sig   albuterol  (VENTOLIN  HFA) 108 (90 Base) MCG/ACT inhaler Inhale 2 puffs into the lungs every 6 (six) hours as needed for wheezing or shortness of breath.   budesonide -formoterol  (SYMBICORT ) 160-4.5 MCG/ACT inhaler Inhale 2 puffs into the lungs 2 (two) times daily.   Cetirizine HCl (ZYRTEC ALLERGY)  10 MG CAPS Take by mouth.   cholecalciferol (VITAMIN D) 1000 UNITS tablet Take 1,000 Units by mouth daily.   DULoxetine (CYMBALTA) 60 MG capsule Take 60 mg by mouth daily.   ipratropium (ATROVENT) 0.03 % nasal spray Place 2 sprays into the nose 2 (two) times daily.   meloxicam (MOBIC) 15 MG tablet Take 15 mg by mouth daily.   mycophenolate  (CELLCEPT ) 500 MG tablet TAKE 2 TABLETS BY MOUTH 2 TIMES DAILY.   nebivolol (BYSTOLIC) 2.5 MG tablet Take 2.5 mg by mouth daily.   omeprazole  (PRILOSEC) 20 MG capsule TAKE 1 CAPSULE (20 MG TOTAL) BY MOUTH 2 (TWO) TIMES DAILY BEFORE A MEAL.   pravastatin (PRAVACHOL) 10 MG tablet Take 10 mg by mouth every other day.   Probiotic Product (PROBIOTIC-10 PO)    XIIDRA 5 % SOLN 2 drops 2 (two) times daily.   acyclovir (ZOVIRAX) 400 MG tablet Take 400 mg by mouth 2 (two) times daily. (Patient not taking: Reported on 03/20/2023)   sulfamethoxazole -trimethoprim  (BACTRIM  DS) 800-160 MG tablet TAKE 1 TABLET BY MOUTH THREE TIMES A WEEK. (Patient not taking: Reported on 03/20/2023)   [DISCONTINUED] predniSONE  (DELTASONE ) 20 MG tablet 2 tablets Orally Once a day for 3 days   No facility-administered encounter medications on file as of 03/20/2023.   Physical Exam: Blood pressure 118/60, pulse 91, temperature 98.2 F (36.8 C), temperature source Oral, height 5\' 3"  (1.6 m), weight 180 lb 12.8 oz (82 kg), SpO2 97 %. Gen:      No acute distress HEENT:  EOMI, sclera anicteric Neck:  No masses; no thyromegaly Lungs:    Clear to auscultation bilaterally; normal respiratory effort CV:         Regular rate and rhythm; no murmurs Abd:      + bowel sounds; soft, non-tender; no palpable masses, no distension Ext:    No edema; adequate peripheral perfusion Skin:      Warm and dry; no rash Neuro: alert and oriented x 3 Psych: normal mood and affect   Data Reviewed: Imaging: High-res CT 05/31/2019-numerous thin-walled pulmonary cysts with basal predominance, superimposed nodular  and masslike pulmonary opacities worsening compared to 2019  High-res CT 03/23/2020-slight worsening of pulmonary cysts.  Stable lung nodules.  High-res CT 08/02/2021-redemonstrated cystic changes, pulmonary nodules.  Decreased density of right upper lobe pulmonary nodule  High-res CT 01/03/2023-cystic lung disease, pulmonary nodules that are stable.  High-res CT/11/2023-stable changes of cystic lung disease and pulmonary nodules I have reviewed the images personally.  PFTs: 03/27/2020 FVC 2.14 [69%], FEV1 1.62 [67%], F/F 76, TLC 5.21 [108%], DLCO 15.02 [78%] Minimal obstruction, minimal diffusion defect.  08/27/2021 FVC 1.86 [60%], FEV1 1.46 [61%], F/F 79, TLC 5.03 [25%], DLCO 15.20 [80%] Normal test.  03/20/2023 FVC 1.77 [57%], FEV1 1.28 [53%], F/F72, TLC 5.48 [111%], DLCO 14.02 [72%]  Labs: CMP 05/23/2020-within normal limits CBC 05/23/2020-WBC 3.1, hemoglobin 11.6  Cardiac: Echocardiogram 06/15/2020-LVEF 60-65%, RV size and function is normal with trivial tricuspid regurgitation, TAPSE 2.2 cm  Assessment:  LIP secondary to Sjogren's syndrome CT scan is characteristic of LIP secondary to Sjogren's syndrome.  Although PFTs are stable she had progression of symptoms and CT findings on Imuran. After discussion with her rheumatologist we switched her to CellCept  in 2021 She is tolerating the dose well.  Off Bactrim  due to side effects. Echo in past with no pulmonary hypertension.  This year she has had a CT scan which is stable.  PFTs show small reduction in diffusion capacity though lung volumes are better which we will continue to monitor  Plan/Recommendations: High-res CT and PFTs in 6 months Continue CellCept   Phyllis Breeze MD  Pulmonary and Critical Care 03/20/2023, 11:10 AM  CC: Vanita Gens, MD

## 2023-03-20 NOTE — Patient Instructions (Signed)
Full PFT performed today. °

## 2023-03-20 NOTE — Progress Notes (Signed)
Full PFT performed today. °

## 2023-12-16 ENCOUNTER — Encounter: Payer: Self-pay | Admitting: Internal Medicine

## 2023-12-16 DIAGNOSIS — J849 Interstitial pulmonary disease, unspecified: Secondary | ICD-10-CM

## 2023-12-16 NOTE — Research (Signed)
 Sponsor: [employer] Intervention Name: Pirfenidone Sports coach (AP01) Study Title: A Randomized, Double-Blind, Placebo-Controlled, Phase 2b Study Evaluating the Safety and Efficacy of Pirfenidone Solution for Inhalation (AP01) in Subjects with Progressive Pulmonary Fibrosis (PPF) Study Number: AP01-007 Study Phase: 2b   Primary Objectives                                                                   To evaluate the effect of AP01 100 mg                    given twice daily (BID) and AP01 50                           mg BID compared to AP01 placebo (hereafter referred to as placebo) on lung function over 52 weeks in subjects with PPF   Primary Endpoints Change from baseline in forced vital capacity (FVC) (mL) at Week 52   [facility] Clinical Research Coordinator note:   This visit for Subject Donna Fields with DOB: 14-Oct-1961 on 12/16/2023 for the above protocol is Visit/Encounter # 1 Screening  and is for purpose of research.   The consent for this encounter is under Protocol Version: Amendment 2 v3.0 dated 23Sep2024 IB: Version 9.0, Dated 09Sep2024 ICF:  Main ICF: [facility] IRB Approved Version 05Nov2024, Revised 05Nov2024 and  is   currently IRB approved.    Subject expressed continued interest and consent in continuing as a study subject. Subject confirmed that there was   no change in contact information (e.g. address, telephone, email). Subject thanked for participation in research and contribution to science. In this visit 12/16/2023 the subject will be evaluated by Principal Investigator named Kalman Shan, MD. This research coordinator has verified that the above investigator is  up to date with his/her training logs.    This visit is a key visit of  screening. The PI is  available for this visit.    Study assessments were completed up to the spirometry. The subject did not meet Inclusion criteria for the spirometry. Subject also did not  meet criteria for protocol defined fibrosing ILD.  Subject determined to be a screen failure per PI at this time.   Signed by  Orlin Hilding, CMA, [facility]   Clinical Research Coordinator [place], [place] 3:44 PM 12/16/2023

## 2023-12-28 ENCOUNTER — Other Ambulatory Visit: Payer: Self-pay | Admitting: Pulmonary Disease

## 2023-12-28 DIAGNOSIS — J842 Lymphoid interstitial pneumonia: Secondary | ICD-10-CM

## 2024-02-02 ENCOUNTER — Encounter: Payer: Self-pay | Admitting: Pulmonary Disease

## 2024-02-23 ENCOUNTER — Ambulatory Visit
Admission: RE | Admit: 2024-02-23 | Discharge: 2024-02-23 | Disposition: A | Discharge status: Home / Self Care | Source: Ambulatory Visit | Attending: Pulmonary Disease | Admitting: Pulmonary Disease

## 2024-02-23 DIAGNOSIS — J849 Interstitial pulmonary disease, unspecified: Secondary | ICD-10-CM

## 2024-03-21 ENCOUNTER — Telehealth: Payer: Self-pay | Admitting: Pulmonary Disease

## 2024-03-21 DIAGNOSIS — J849 Interstitial pulmonary disease, unspecified: Secondary | ICD-10-CM

## 2024-03-21 NOTE — Telephone Encounter (Signed)
 Patient needs order for PFT. Patient phone number is [phone].

## 2024-03-21 NOTE — Telephone Encounter (Signed)
 Order has been placed based off of last OV note.

## 2024-04-11 ENCOUNTER — Ambulatory Visit: Payer: Self-pay | Admitting: Pulmonary Disease

## 2024-05-03 ENCOUNTER — Encounter: Payer: Self-pay | Admitting: Pulmonary Disease

## 2024-05-03 ENCOUNTER — Ambulatory Visit (HOSPITAL_BASED_OUTPATIENT_CLINIC_OR_DEPARTMENT_OTHER): Admitting: Pulmonary Disease

## 2024-05-03 ENCOUNTER — Ambulatory Visit: Admitting: Pulmonary Disease

## 2024-05-03 VITALS — BP 132/81 | HR 83 | Ht 62.0 in | Wt 175.0 lb

## 2024-05-03 DIAGNOSIS — I2510 Atherosclerotic heart disease of native coronary artery without angina pectoris: Secondary | ICD-10-CM

## 2024-05-03 DIAGNOSIS — J842 Lymphoid interstitial pneumonia: Secondary | ICD-10-CM

## 2024-05-03 DIAGNOSIS — J849 Interstitial pulmonary disease, unspecified: Secondary | ICD-10-CM

## 2024-05-03 DIAGNOSIS — Z5181 Encounter for therapeutic drug level monitoring: Secondary | ICD-10-CM

## 2024-05-03 DIAGNOSIS — M3500 Sicca syndrome, unspecified: Secondary | ICD-10-CM

## 2024-05-03 LAB — PULMONARY FUNCTION TEST
DL/VA % pred: 90 %
DL/VA: 3.83 ml/min/mmHg/L
DLCO cor % pred: 69 %
DLCO cor: 13.41 ml/min/mmHg
DLCO unc % pred: 69 %
DLCO unc: 13.41 ml/min/mmHg
FEF 25-75 Post: 1.08 L/s
FEF 25-75 Pre: 0.77 L/s
FEF2575-%Change-Post: 41 %
FEF2575-%Pred-Post: 51 %
FEF2575-%Pred-Pre: 36 %
FEV1-%Change-Post: 9 %
FEV1-%Pred-Post: 53 %
FEV1-%Pred-Pre: 49 %
FEV1-Post: 1.27 L
FEV1-Pre: 1.16 L
FEV1FVC-%Change-Post: 3 %
FEV1FVC-%Pred-Pre: 92 %
FEV6-%Change-Post: 5 %
FEV6-%Pred-Post: 58 %
FEV6-%Pred-Pre: 55 %
FEV6-Post: 1.72 L
FEV6-Pre: 1.63 L
FEV6FVC-%Pred-Post: 104 %
FEV6FVC-%Pred-Pre: 104 %
FVC-%Change-Post: 5 %
FVC-%Pred-Post: 56 %
FVC-%Pred-Pre: 53 %
FVC-Post: 1.72 L
FVC-Pre: 1.63 L
Post FEV1/FVC ratio: 74 %
Post FEV6/FVC ratio: 100 %
Pre FEV1/FVC ratio: 71 %
Pre FEV6/FVC Ratio: 100 %
RV % pred: 164 %
RV: 3.31 L
TLC % pred: 107 %
TLC: 5.26 L

## 2024-05-03 NOTE — Patient Instructions (Signed)
 VISIT SUMMARY:  Today, we reviewed your respiratory condition, Sjogren's syndrome, and recent findings of atherosclerosis in your coronary artery. Your current medications and recent test results were discussed, and we have outlined a plan to manage your conditions moving forward.  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 Your condition is stable with your current medication, Cellcept . We will continue this medication and periodically review your CT scans and lung function tests to monitor your condition.  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 This is not significantly concerning at this time. We will continue your current treatment and reassess your lung function as needed.  -SJOGREN'S SYNDROME: Sjogren's syndrome is an autoimmune disease that affects moisture-producing glands. Your condition is well-managed with Cellcept , which you should continue taking as prescribed. We will also obtain lab results from your rheumatologist, Dr. Cay Cocking, for review.  -ATHEROSCLEROSIS OF CORONARY ARTERY: Atherosclerosis is the buildup of plaque in your arteries, which can restrict blood flow. This has been found in your coronary artery. You are awaiting an appointment with a cardiologist, and in the meantime, you should monitor for any new symptoms such as chest pressure or angina. A stress test may be considered during your cardiology evaluation.  INSTRUCTIONS:  Please continue taking Cellcept  as prescribed, two tablets every morning and evening. Monitor for any new symptoms such as chest pressure or angina and report them immediately. We will periodically review your CT scans and lung function tests. Await your  cardiology evaluation at [facility] in September. Additionally, we will obtain lab results from Dr. Cay Cocking for review.

## 2024-05-03 NOTE — Progress Notes (Signed)
 Full PFT performed today.

## 2024-05-03 NOTE — Patient Instructions (Addendum)
 Full PFT performed today.

## 2024-05-03 NOTE — Progress Notes (Signed)
 SAMANTH Fields    [id number]    1960-12-23  Primary Care Physician:Ramachandran, Doyle Generous, MD  Referring Physician: Vanita Gens, MD [address] [place],  [place] [address]  Problem list:   Interstitial lung disease, LIP Sjogren's syndrome On Imuran since 2019, switched to Cellcept  July 2021  HPI: 63 y.o. with history of Sjogren's syndrome.  This was diagnosed in her 30s Noted to have interstitial lung disease consistent with likely about 4 years ago. Started on Imuran in 2019 by Dr. Bascom Lily, rheumatology CT scan and PFTs show progressive worsening over the years and has been referred for help with management Switch to CellCept  in July 2021. Off Bactrim  as she cannot tolerate the medication.  Chief complaint is dyspnea on exertion, occasional dizziness.  No cough, sputum production.  Denies any lower extremity weakness. She notes that her heart rate goes up with minimal exertion and stays elevated for prolonged.  Pets: Cats.  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 Exposures: No mold, hot tub, Jacuzzi.  No feather pillows or comforters Smoking history: Never smoker Travel history: No significant travel Relevant family history: No significant family history of lung disease  Interim history: Discussed the use of AI scribe software for clinical note transcription with the patient, who gave verbal consent to proceed.  History of Present Illness Donna Fields is a 63 year old female with Sjogren's syndrome and interstitial lung disease who presents for follow-up of her respiratory condition.  She experiences shortness of breath, stating 'I get out of breath pretty quick.' She is currently taking Cellcept , two tablets every morning and evening, for her Sjogren's syndrome and interstitial lung disease, a regimen she has been on since July 2021.  Previously, she was on Imuran before switching to Cellcept .  A recent CT scan showed stability in her condition. She has been diagnosed with atherosclerosis or calcification in the LAD of her coronary artery. She is awaiting an appointment with a cardiologist at [facility], but she is booked through September.  Recent labs were done earlier this year, and her liver tests and CBC were normal as of February. No chest pressure, tendonitis, or angina symptoms. She acknowledges having some dyspnea.   Outpatient Encounter Medications as of 05/03/2024  Medication Sig   acyclovir (ZOVIRAX) 400 MG tablet Take 400 mg by mouth 2 (two) times daily.   albuterol  (VENTOLIN  HFA) 108 (90 Base) MCG/ACT inhaler Inhale 2 puffs into the lungs every 6 (six) hours as needed for wheezing or shortness of breath.   budesonide -formoterol  (SYMBICORT ) 160-4.5 MCG/ACT inhaler Inhale 2 puffs into the lungs 2 (two) times daily.   Cetirizine HCl (ZYRTEC ALLERGY) 10 MG CAPS Take by mouth.   cholecalciferol (VITAMIN D) 1000 UNITS tablet Take 1,000 Units by mouth daily.   DULoxetine (CYMBALTA) 60 MG capsule Take 60 mg by mouth daily.   ipratropium (ATROVENT) 0.03 % nasal spray Place 2 sprays into the nose 2 (two) times daily.   meloxicam (MOBIC) 15 MG tablet Take 15 mg by mouth daily.   mycophenolate  (CELLCEPT ) 500 MG tablet TAKE 2 TABLETS BY MOUTH 2 TIMES DAILY.   nebivolol (BYSTOLIC) 2.5 MG tablet Take 2.5 mg by mouth daily.   omeprazole  (PRILOSEC) 20 MG capsule TAKE 1 CAPSULE (20 MG TOTAL) BY MOUTH 2 (TWO) TIMES DAILY BEFORE A MEAL.  pravastatin (PRAVACHOL) 10 MG tablet Take 10 mg by mouth every other day.   Probiotic Product (PROBIOTIC-10 PO)    XIIDRA 5 % SOLN 2 drops 2 (two) times daily.   sulfamethoxazole -trimethoprim  (BACTRIM  DS) 800-160 MG tablet TAKE 1 TABLET BY MOUTH THREE TIMES A WEEK. (Patient not taking: Reported on 03/20/2023)   No facility-administered encounter medications on file as of 05/03/2024.    Physical Exam: Blood pressure 132/81, pulse 83, height 5\' 2"  (1.575 m), weight 175 lb (79.4 kg), SpO2 96%. Gen:      No acute distress HEENT:  EOMI, sclera anicteric Neck:     No masses; no thyromegaly Lungs:    Clear to auscultation bilaterally; normal respiratory effort CV:         Regular rate and rhythm; no murmurs Abd:      + bowel sounds; soft, non-tender; no palpable masses, no distension Ext:    No edema; adequate peripheral perfusion Neuro: alert and oriented x 3 Psych: normal mood and affect   Data Reviewed: Imaging: High-res CT 05/31/2019-numerous thin-walled pulmonary cysts with basal predominance, superimposed nodular and masslike pulmonary opacities worsening compared to 2019  High-res CT 03/23/2020-slight worsening of pulmonary cysts.  Stable lung nodules.  High-res CT 08/02/2021-redemonstrated cystic changes, pulmonary nodules.  Decreased density of right upper lobe pulmonary nodule  High-res CT 01/03/2023-cystic lung disease, pulmonary nodules that are stable.  High-res CT 02/23/2024-stable changes of cystic lung disease and pulmonary nodules, atherosclerosis of LAD  I have reviewed the images personally.  PFTs: 03/27/2020 FVC 2.14 [69%], FEV1 1.62 [67%], F/F 76, TLC 5.21 [108%], DLCO 15.02 [78%] Minimal obstruction, minimal diffusion defect.  08/27/2021 FVC 1.86 [60%], FEV1 1.46 [61%], F/F 79, TLC 5.03 [25%], DLCO 15.20 [80%] Normal test.  03/20/2023 FVC 1.77 [57%], FEV1 1.28 [53%], F/F72, TLC 5.48 [111%], DLCO 14.02 [72%]  05/03/2024 FVC 1.72 [56%], FEV1 1.27 [53%], F/F74, TLC 5.26 [107%], DLCO 13.41 [69%] Mild diffusion defect  Labs: CMP 05/23/2020-within normal limits CBC 05/23/2020-WBC 3.1, hemoglobin 11.6  CBC, CMP 01/21/2024- within normal limits, WBC 5.1, hemoglobin 11.8, platelets 304  Cardiac: Echocardiogram 06/15/2020-LVEF 60-65%, RV size and function is normal with trivial tricuspid regurgitation, TAPSE 2.2 cm Assessment & Plan Lymphocytic  interstitial pneumonia Lymphocytic interstitial pneumonia is well-managed with Cellcept . CT scan indicates stability in lung condition. Mild diffusion impairment is present, likely due to interstitial lung disease, with slight decrease in lung function compared to previous assessments, but not significantly concerning. - Continue Cellcept . - Review CT scan and lung function tests periodically.  Sjogren's syndrome Sjogren's syndrome is well-managed with Cellcept . She is under the care of a rheumatologist, Dr. Cay Cocking, who conducts regular lab tests.  Atherosclerosis of coronary artery Atherosclerosis in the LAD of the coronary artery, possibly related to inflammation from Sjogren's syndrome. No symptoms of angina or chest pressure. Referral to [facility] is pending until September due to scheduling. She is advised to monitor for new symptoms such as chest pressure or angina. A stress test may be considered during cardiology evaluation. - Await cardiology evaluation at [facility]. - Monitor for new symptoms such as chest pressure or angina.    Plan/Recommendations: Follow-up in 6 months Continue CellCept   Kenyah Luba MD [facility] 05/03/2024, 1:37 PM  CC: Vanita Gens, MD

## 2024-06-27 ENCOUNTER — Other Ambulatory Visit: Payer: Self-pay | Admitting: Pulmonary Disease

## 2024-06-27 DIAGNOSIS — Z5181 Encounter for therapeutic drug level monitoring: Secondary | ICD-10-CM

## 2024-06-27 DIAGNOSIS — J842 Lymphoid interstitial pneumonia: Secondary | ICD-10-CM

## 2024-06-28 NOTE — Telephone Encounter (Signed)
 Orders for CBC and CMP released for [facility] in [place]

## 2024-06-28 NOTE — Telephone Encounter (Signed)
 ATC patient - patient overdue for CBC monitoring. Left VM  Needs CBC and CMP monitored q 3 months on Cellcept   Sherry Pennant, PharmD, MPH, BCPS, CPP Clinical Pharmacist (Rheumatology and Pulmonology)\

## 2024-07-02 LAB — CBC WITH DIFFERENTIAL/PLATELET
Basophils Absolute: 0 x10E3/uL (ref 0.0–0.2)
Basos: 1 %
EOS (ABSOLUTE): 0.2 x10E3/uL (ref 0.0–0.4)
Eos: 4 %
Hematocrit: 37.5 % (ref 34.0–46.6)
Hemoglobin: 12 g/dL (ref 11.1–15.9)
Immature Grans (Abs): 0 x10E3/uL (ref 0.0–0.1)
Immature Granulocytes: 0 %
Lymphocytes Absolute: 0.8 x10E3/uL (ref 0.7–3.1)
Lymphs: 20 %
MCH: 29.8 pg (ref 26.6–33.0)
MCHC: 32 g/dL (ref 31.5–35.7)
MCV: 93 fL (ref 79–97)
Monocytes Absolute: 0.5 x10E3/uL (ref 0.1–0.9)
Monocytes: 12 %
Neutrophils Absolute: 2.5 x10E3/uL (ref 1.4–7.0)
Neutrophils: 63 %
Platelets: 357 x10E3/uL (ref 150–450)
RBC: 4.03 x10E6/uL (ref 3.77–5.28)
RDW: 14.1 % (ref 11.7–15.4)
WBC: 3.9 x10E3/uL (ref 3.4–10.8)

## 2024-07-02 LAB — COMPREHENSIVE METABOLIC PANEL WITH GFR
ALT: 12 IU/L (ref 0–32)
AST: 14 IU/L (ref 0–40)
Albumin: 4.4 g/dL (ref 3.9–4.9)
Alkaline Phosphatase: 88 IU/L (ref 44–121)
BUN/Creatinine Ratio: 15 (ref 12–28)
BUN: 11 mg/dL (ref 8–27)
Bilirubin Total: 0.3 mg/dL (ref 0.0–1.2)
CO2: 21 mmol/L (ref 20–29)
Calcium: 9.8 mg/dL (ref 8.7–10.3)
Chloride: 103 mmol/L (ref 96–106)
Creatinine, Ser: 0.74 mg/dL (ref 0.57–1.00)
Globulin, Total: 2.8 g/dL (ref 1.5–4.5)
Glucose: 89 mg/dL (ref 70–99)
Potassium: 4.4 mmol/L (ref 3.5–5.2)
Sodium: 137 mmol/L (ref 134–144)
Total Protein: 7.2 g/dL (ref 6.0–8.5)
eGFR: 90 mL/min/1.73 (ref >59)

## 2024-07-04 ENCOUNTER — Ambulatory Visit: Payer: Self-pay | Admitting: Pharmacist

## 2024-07-04 MED ORDER — MYCOPHENOLATE MOFETIL 500 MG PO TABS: 1000.0000 mg | ORAL_TABLET | Freq: Two times a day (BID) | ORAL | 1 refills | Status: DC

## 2024-08-30 ENCOUNTER — Encounter: Payer: Self-pay | Admitting: Cardiology

## 2024-08-30 ENCOUNTER — Ambulatory Visit: Attending: Cardiology | Admitting: Cardiology

## 2024-08-30 VITALS — BP 156/85 | HR 87 | Resp 16 | Ht 62.0 in | Wt 162.6 lb

## 2024-08-30 DIAGNOSIS — I10 Essential (primary) hypertension: Secondary | ICD-10-CM

## 2024-08-30 DIAGNOSIS — E782 Mixed hyperlipidemia: Secondary | ICD-10-CM

## 2024-08-30 DIAGNOSIS — I2510 Atherosclerotic heart disease of native coronary artery without angina pectoris: Secondary | ICD-10-CM | POA: Diagnosis not present

## 2024-08-30 DIAGNOSIS — R0609 Other forms of dyspnea: Secondary | ICD-10-CM | POA: Diagnosis not present

## 2024-08-30 NOTE — Patient Instructions (Signed)
 Medication Instructions:  Your physician recommends that you continue on your current medications as directed. Please refer to the Current Medication list given to you today.  *If you need a refill on your cardiac medications before your next appointment, please call your pharmacy*  Testing/Procedures: Your physician has requested that you have an echocardiogram. Echocardiography is a painless test that uses sound waves to create images of your heart. It provides your doctor with information about the size and shape of your heart and how well your heart's chambers and valves are working. This procedure takes approximately one hour. There are no restrictions for this procedure. Please do NOT wear cologne, perfume, aftershave, or lotions (deodorant is allowed). Please arrive 15 minutes prior to your appointment time.  Please note: We ask at that you not bring children with you during ultrasound (echo/ vascular) testing. Due to room size and safety concerns, children are not allowed in the ultrasound rooms during exams. Our front office staff cannot provide observation of children in our lobby area while testing is being conducted. An adult accompanying a patient to their appointment will only be allowed in the ultrasound room at the discretion of the ultrasound technician under special circumstances. We apologize for any inconvenience.  Your physician has requested that you have en exercise stress myoview. For further information please visit [email or web address] Please follow instruction sheet, as given.   Follow-Up: At [facility], you and your health needs are our priority.  As part of our continuing mission to provide you with exceptional heart care, our providers are all part of one team.  This team includes your primary Cardiologist (physician) and Advanced Practice Providers or APPs (Physician Assistants and Nurse Practitioners) who all work together to provide you with the care you  need, when you need it.  Your next appointment:   14 months--December 2026  Provider:   Madonna Large, DO   We recommend signing up for the patient portal called MyChart.  Sign up information is provided on this After Visit Summary.  MyChart is used to connect with patients for Virtual Visits (Telemedicine).  Patients are able to view lab/test results, encounter notes, upcoming appointments, etc.  Non-urgent messages can be sent to your provider as well.    To learn more about what you can do with MyChart, go to [email or web address].

## 2024-08-30 NOTE — Progress Notes (Signed)
 Cardiology Office Note:    Date:  08/30/2024  NAME:  Donna Fields    MRN: [id number] DOB:  01-21-1961   PCP:  Verdia Lombard, MD  Former Cardiology Providers: None Primary Cardiologist:  Madonna Large, DO, [facility] (established care 08/30/2024) Electrophysiologist:  None   Referring MD: Verdia Lombard, MD  Reason of Consult: Coronary calcification  Chief Complaint  Patient presents with   Coronary artery disease involving native coronary artery of   New Patient (Initial Visit)    History of Present Illness:    Donna Fields is a 63 y.o. Caucasian female whose past medical history and cardiovascular risk factors includes: Coronary artery calcification, hyperlipidemia, history of adrenal adenoma, interstitial lung disease, Sjogren syndrome, COPD, asthma, arthritis. She is being seen today for the evaluation of coronary calcification at the request of Verdia Lombard, MD.  Patient has interstitial lung disease and recent shortness of breath led to CT of the chest in April 2025 which noted coronary calcification in the LAD distribution.  She is referred to cardiology for further evaluation and management.  She denies anginal chest pain or heart failure symptoms. She has lost nearly 20 pounds due to improved diet and increasing physical activity. Patient states that she has baseline shortness of breath with over exertional activities but she has been attributing that to her underlying lung disease. Home are variable with SBP ranging between 140-150 mmHg. No structured exercise program or daily routine. She works at the [employer] so that keeps her active.   No first degree relatives with premature coronary disease or sudden cardiac death.  Current Medications: Current Meds  Medication Sig   acyclovir (ZOVIRAX) 400 MG tablet Take 400 mg by mouth 2 (two) times daily.   albuterol  (VENTOLIN  HFA) 108 (90 Base) MCG/ACT inhaler Inhale 2 puffs into the lungs every 6  (six) hours as needed for wheezing or shortness of breath.   budesonide -formoterol  (SYMBICORT ) 160-4.5 MCG/ACT inhaler Inhale 2 puffs into the lungs 2 (two) times daily.   Cetirizine HCl (ZYRTEC ALLERGY) 10 MG CAPS Take by mouth.   cholecalciferol (VITAMIN D) 1000 UNITS tablet Take 1,000 Units by mouth daily.   DULoxetine (CYMBALTA) 60 MG capsule Take 60 mg by mouth daily.   ipratropium (ATROVENT) 0.03 % nasal spray Place 2 sprays into the nose 2 (two) times daily.   meloxicam (MOBIC) 15 MG tablet Take 15 mg by mouth daily.   mycophenolate  (CELLCEPT ) 500 MG tablet Take 2 tablets (1,000 mg total) by mouth 2 (two) times daily.   nebivolol (BYSTOLIC) 2.5 MG tablet Take 2.5 mg by mouth daily.   omeprazole  (PRILOSEC) 20 MG capsule TAKE 1 CAPSULE (20 MG TOTAL) BY MOUTH 2 (TWO) TIMES DAILY BEFORE A MEAL.   pravastatin (PRAVACHOL) 20 MG tablet Take 20 mg by mouth daily.   Probiotic Product (PROBIOTIC-10 PO)    XIIDRA 5 % SOLN 2 drops 2 (two) times daily.     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   Pneumonia    Prediabetes 05/03/2021  Pure hypercholesterolemia 05/03/2021   Shingles 05/03/2021   Sjogren's syndrome    Snoring 03/29/2020   Vitamin D deficiency 05/03/2021    Past Surgical History: Past Surgical History:  Procedure Laterality Date   NASAL SINUS SURGERY  2000    Social History: Social History   Tobacco Use   Smoking status: Never   Smokeless tobacco:  Never  Substance Use Topics   Alcohol use: Yes    Alcohol/week: 0.0 standard drinks of alcohol    Comment: wine 2 x per wk   Drug use: No    Family History: Family History  Problem Relation Age of Onset   Breast cancer Mother    Skin cancer Mother    Heart disease Father    Rheum arthritis Father    Allergies Brother     ROS:   Review of Systems  Constitutional: Positive for weight loss.  Cardiovascular:  Positive for dyspnea on exertion (chronic and stable). Negative for chest pain, claudication, irregular heartbeat, leg swelling, near-syncope, orthopnea, palpitations, paroxysmal nocturnal dyspnea and syncope.  Respiratory:  Positive for shortness of breath (chronic).   Hematologic/Lymphatic: Negative for bleeding problem.    EKGs/Labs/Other Studies Reviewed:   EKG: EKG Interpretation Date/Time:  Tuesday August 30 2024 09:31:56 EDT Ventricular Rate:  84 PR Interval:  148 QRS Duration:  80 QT Interval:  372 QTC Calculation: 439 R Axis:   46  Text Interpretation: Normal sinus rhythm Normal ECG No previous ECGs available Confirmed by Michele Richardson [phone]) on 08/30/2024 10:19:54 AM  Echocardiogram: 05/2020 LVEF 60 to 65%, average GLS -25%. Right ventricular size and function. No significant valvular heart disease. Estimated RAP 3 mmHg  RADIOLOGY: CT chest high-resolution April 2025 1. Pulmonary parenchymal findings of lymphocytic interstitial pneumonia, related to Sjogren syndrome, stable. 2. Right adrenal adenoma. 3. Age advanced left anterior descending coronary artery calcification. Labs:    Latest Ref Rng & Units 07/01/2024    8:05 AM 08/28/2021   12:18 PM 05/03/2021   12:37 PM  CBC  WBC 3.4 - 10.8 x10E3/uL 3.9  4.1  5.2   Hemoglobin 11.1 - 15.9 g/dL 87.9  87.9  88.1   Hematocrit 34.0 - 46.6 % 37.5  36.3  35.9   Platelets 150 - 450 x10E3/uL 357  364.0  334.0        Latest Ref Rng & Units 07/01/2024    8:05 AM 08/28/2021   12:18 PM 05/03/2021   12:37 PM   BMP  Glucose 70 - 99 mg/dL 89  92  91   BUN 8 - 27 mg/dL 11  15  22    Creatinine 0.57 - 1.00 mg/dL 9.25  9.21  9.19   BUN/Creat Ratio 12 - 28 15     Sodium 134 - 144 mmol/L 137  135  136   Potassium 3.5 - 5.2 mmol/L 4.4  4.6  5.0   Chloride 96 - 106 mmol/L 103  103  103   CO2 20 - 29 mmol/L 21  25  25    Calcium 8.7 - 10.3 mg/dL 9.8  9.7  9.8       Latest Ref Rng & Units 07/01/2024    8:05 AM 08/28/2021   12:18 PM 05/03/2021   12:37 PM  CMP  Glucose 70 - 99 mg/dL 89  92  91   BUN 8 - 27 mg/dL 11  15  22    Creatinine 0.57 - 1.00 mg/dL 9.25  9.21  9.19   Sodium 134 - 144 mmol/L 137  135  136   Potassium 3.5 - 5.2 mmol/L 4.4  4.6  5.0   Chloride 96 - 106 mmol/L 103  103  103   CO2 20 - 29 mmol/L 21  25  25    Calcium 8.7 - 10.3 mg/dL 9.8  9.7  9.8   Total Protein 6.0 - 8.5 g/dL 7.2  7.7  7.9   Total Bilirubin 0.0 - 1.2 mg/dL 0.3  0.5  0.4   Alkaline Phos 44 - 121 IU/L 88  61  70   AST 0 - 40 IU/L 14  16  23    ALT 0 - 32 IU/L 12  12  16      No results found for: CHOL, HDL, LDLCALC, LDLDIRECT, TRIG, CHOLHDL No results for input(s): LIPOA in the last 8760 hours. No components found for: NTPROBNP No results for input(s): PROBNP in the last 8760 hours. No results for input(s): TSH in the last 8760 hours.  External Labs: Collected: 01/20/2024 provided by PCP. Hemoglobin 11.8 g/dL. BUN 23, creatinine 0.76. Sodium 137, potassium 5, chloride 102, bicarb 21. AST, ALT, alkaline phosphatase within normal limits. ESR and CRP within normal limits  CMP+A1c+Lipid panel Reviewed date:04/27/2024 04:53:06 PM Interpretation: Performing Oja:[facility] [place], [address], [place], [place] [id number], Phone - [phone], Director - MDNagendra Notes/Report:  Glucose 94 70-99 mg/dL    Hemoglobin J8r 5.9 5.1-4.3 % .  Prediabetes: 5.7 - 6.4  Diabetes: >6.4  Glycemic control for adults with diabetes: <7.0   BUN 17 8-27 mg/dL    Creatinine 9.24 9.42-8.99 mg/dL    eGFR 89  >40 fO/fpw/8.26    BUN/Creatinine Ratio 23 12-28    Sodium 141 134-144 mmol/L    Potassium 5.0 3.5-5.2 mmol/L    Chloride 106 96-106 mmol/L    Carbon Dioxide, Total 20 20-29 mmol/L    Calcium 9.7 8.7-10.3 mg/dL    Protein, Total 7.3 3.9-1.4 g/dL    Albumin 4.4 6.0-5.0 g/dL    Globulin, Total 2.9 1.5-4.5 g/dL    Bilirubin, Total 0.2 0.0-1.2 mg/dL    Alkaline Phosphatase 94 44-121 IU/L    AST (SGOT) 14 0-40 IU/L    ALT (SGPT) 11 0-32 IU/L    Cholesterol, Total 192 100-199 mg/dL    Triglycerides 72 9-850 mg/dL    HDL Cholesterol 45 >60 mg/dL    VLDL Cholesterol Cal 13 5-40 mg/dL    LDL Chol Calc (NIH) 134 0-99 mg/dL     Physical Exam:    Today's Vitals   08/30/24 0929  BP: (!) 156/85  Pulse: 87  Resp: 16  SpO2: 94%  Weight: 162 lb 9.6 oz (73.8 kg)  Height: 5' 2 (1.575 m)   Body mass index is 29.74 kg/m. Wt Readings from Last 3 Encounters:  08/30/24 162 lb 9.6 oz (73.8 kg)  05/03/24 175 lb (79.4 kg)  03/20/23 180 lb 12.8 oz (82 kg)    Physical Exam  Constitutional: No distress.  hemodynamically stable  Neck: No JVD present.  Cardiovascular: Normal rate, regular rhythm, S1 normal and S2 normal. Exam reveals no gallop, no S3 and no S4.  No murmur heard. Pulmonary/Chest: Effort normal and breath sounds normal. No stridor. She has no wheezes. She has no rales.  Musculoskeletal:        General: No edema.     Cervical back: Neck supple.  Skin: Skin is warm.     Impression & Recommendation(s):  Impression:   ICD-10-CM   1. Coronary artery disease involving native coronary artery of native  heart without angina pectoris  I25.10 EKG 12-Lead    ECHOCARDIOGRAM COMPLETE    MYOCARDIAL PERFUSION IMAGING    Cardiac Stress Test: Informed Consent Details: Physician/Practitioner Attestation; Transcribe to consent form and obtain patient signature    2. Dyspnea on exertion  R06.09 ECHOCARDIOGRAM COMPLETE    MYOCARDIAL PERFUSION IMAGING    Cardiac Stress Test: Informed Consent  Details: Physician/Practitioner Attestation; Transcribe to consent form and obtain patient signature    3. Benign essential hypertension  I10     4. Mixed hyperlipidemia  E78.2        Recommendation(s):  Coronary artery disease involving native coronary artery of native heart without angina pectoris Incidentally noted to have coronary artery calcification on nongated CT study Denies anginal chest pain or heart failure symptoms Shortness of breath with effort related activities chronic and stable likely secondary underlying lung disease but cardiac involvement cannot be ruled out. Given her CAC, risk factors, and age recommending additional workup. Echo will be ordered to evaluate for structural heart disease and left ventricular systolic function. Exercise nuclear stress test to evaluate for reversible ischemia and functional capacity If stress test is reported to be low risk would recommend continuing to optimize her modifiable cardiovascular risk factors.  Dyspnea on exertion Multifactorial: Underlying lung disease, coronary disease workup ongoing, uncontrolled hypertension Management of interstitial lung disease per her primary provider. Ischemic workup as outlined above Will defer blood pressure management to PCP Continue to monitor  Benign essential hypertension Office blood pressures are not at goal. Home SBP ranges between 140-150 mmHg. Recommend a goal SBP around 130 mmHg. Will defer her back to her PCP for uptitration of her blood pressure medications.  In the meantime patient is advised to keep a log of her blood pressures at home and to review with PCP. Reemphasized importance of low-salt diet.  Mixed hyperlipidemia Recent LDL between 130-140 mg/dL. PCP recently uptitrated pravastatin to 20 mg p.o. daily, in the past she was on pravastatin 10 mg every other day. Recommended that she follows up with PCP for repeat lipid profile Recommended goal LDL <70 mg/dL   Orders  Placed:  Orders Placed This Encounter  Procedures   Cardiac Stress Test: Informed Consent Details: Physician/Practitioner Attestation; Transcribe to consent form and obtain patient signature    Physician/Practitioner attestation of informed consent for procedure/surgical case:   I, the physician/practitioner, attest that I have discussed with the patient the benefits, risks, side effects, alternatives, likelihood of achieving goals and potential problems during recovery for the procedure that I have provided informed consent.    Procedure:   Myoview    Indication/Reason:   Shortness of breath and coronary calcification   MYOCARDIAL PERFUSION IMAGING    Standing Status:   Future    Expiration Date:   08/30/2025    Patient weight in lbs:   162    [facility]    Type of stress:   Exercise   EKG 12-Lead   ECHOCARDIOGRAM COMPLETE    Standing Status:   Future    Expiration Date:   08/30/2025    Where should this test be performed:   [facility]    Does the patient weigh less than or greater than 250 lbs?:   Patient weighs less than 250 lbs    Perflutren DEFINITY (image enhancing agent) should be administered unless hypersensitivity or allergy exist:   Administer Perflutren    Reason for exam-Echo:  Other-Full Diagnosis List    Full ICD-10/Reason for Exam:   SOB (shortness of breath) [758119]     Final Medication List:   No orders of the defined types were placed in this encounter.   Medications Discontinued During This Encounter  Medication Reason   pravastatin (PRAVACHOL) 10 MG tablet Dose change     Current Outpatient Medications:    acyclovir (ZOVIRAX) 400 MG tablet, Take 400 mg by mouth 2 (two) times daily., Disp: , Rfl:    albuterol  (VENTOLIN  HFA) 108 (90 Base) MCG/ACT inhaler, Inhale 2 puffs into the lungs every 6 (six) hours as needed for wheezing or shortness of breath., Disp: , Rfl:    budesonide -formoterol  (SYMBICORT ) 160-4.5  MCG/ACT inhaler, Inhale 2 puffs into the lungs 2 (two) times daily., Disp: 10.2 g, Rfl: 11   Cetirizine HCl (ZYRTEC ALLERGY) 10 MG CAPS, Take by mouth., Disp: , Rfl:    cholecalciferol (VITAMIN D) 1000 UNITS tablet, Take 1,000 Units by mouth daily., Disp: , Rfl:    DULoxetine (CYMBALTA) 60 MG capsule, Take 60 mg by mouth daily., Disp: , Rfl:    ipratropium (ATROVENT) 0.03 % nasal spray, Place 2 sprays into the nose 2 (two) times daily., Disp: , Rfl:    meloxicam (MOBIC) 15 MG tablet, Take 15 mg by mouth daily., Disp: , Rfl:    mycophenolate  (CELLCEPT ) 500 MG tablet, Take 2 tablets (1,000 mg total) by mouth 2 (two) times daily., Disp: 360 tablet, Rfl: 1   nebivolol (BYSTOLIC) 2.5 MG tablet, Take 2.5 mg by mouth daily., Disp: , Rfl:    omeprazole  (PRILOSEC) 20 MG capsule, TAKE 1 CAPSULE (20 MG TOTAL) BY MOUTH 2 (TWO) TIMES DAILY BEFORE A MEAL., Disp: 180 capsule, Rfl: 0   pravastatin (PRAVACHOL) 20 MG tablet, Take 20 mg by mouth daily., Disp: , Rfl:    Probiotic Product (PROBIOTIC-10 PO), , Disp: , Rfl:    XIIDRA 5 % SOLN, 2 drops 2 (two) times daily., Disp: , Rfl:   Consent:   Informed Consent   Shared Decision Making/Informed Consent The risks [chest pain, shortness of breath, cardiac arrhythmias, dizziness, blood pressure fluctuations, myocardial infarction, stroke/transient ischemic attack, nausea, vomiting, allergic reaction, radiation exposure, metallic taste sensation and life-threatening complications (estimated to be 1 in 10,000)], benefits (risk stratification, diagnosing coronary artery disease, treatment guidance) and alternatives of a nuclear stress test were discussed in detail with Donna Fields and she agrees to proceed.     Disposition:   Patient has a yearly physical in December of each year. Recommend follow-up with cardiology in December 2026 after her annual well visit, sooner if needed   Her questions and concerns were addressed to her satisfaction. She voices understanding  of the recommendations provided during this encounter.    Signed, Madonna Michele HAS, [facility]  A Division of [facility] [address] [place], [place] [address]  08/30/2024 2:29 PM

## 2024-10-07 ENCOUNTER — Telehealth (HOSPITAL_COMMUNITY): Payer: Self-pay | Admitting: *Deleted

## 2024-10-07 NOTE — Telephone Encounter (Signed)
 Patient given detailed instructions per Myocardial Perfusion Study Information Sheet for the test on 10/14/2024 at 12:30. Patient notified to arrive 15 minutes early and that it is imperative to arrive on time for appointment to keep from having the test rescheduled.  If you need to cancel or reschedule your appointment, please call the office within 24 hours of your appointment. . Patient verbalized understanding.Donna Fields

## 2024-10-10 ENCOUNTER — Other Ambulatory Visit: Payer: Self-pay | Admitting: Cardiology

## 2024-10-10 DIAGNOSIS — I2510 Atherosclerotic heart disease of native coronary artery without angina pectoris: Secondary | ICD-10-CM

## 2024-10-10 DIAGNOSIS — R0609 Other forms of dyspnea: Secondary | ICD-10-CM

## 2024-10-14 ENCOUNTER — Ambulatory Visit (HOSPITAL_BASED_OUTPATIENT_CLINIC_OR_DEPARTMENT_OTHER)
Admission: RE | Admit: 2024-10-14 | Discharge: 2024-10-14 | Disposition: A | Discharge status: Home / Self Care | Source: Ambulatory Visit | Attending: Cardiology | Admitting: Cardiology

## 2024-10-14 ENCOUNTER — Ambulatory Visit (HOSPITAL_COMMUNITY)
Admission: RE | Admit: 2024-10-14 | Discharge: 2024-10-14 | Disposition: A | Discharge status: Home / Self Care | Source: Ambulatory Visit | Attending: Cardiology | Admitting: Cardiology

## 2024-10-14 DIAGNOSIS — R0609 Other forms of dyspnea: Secondary | ICD-10-CM | POA: Insufficient documentation

## 2024-10-14 DIAGNOSIS — I2510 Atherosclerotic heart disease of native coronary artery without angina pectoris: Secondary | ICD-10-CM | POA: Diagnosis present

## 2024-10-14 LAB — MYOCARDIAL PERFUSION IMAGING
Base ST Depression (mm): 0 mm
LV dias vol: 73 mL (ref 46–106)
LV sys vol: 26 mL (ref 3.8–5.2)
Nuc Stress EF: 64 %
Peak HR: 109 {beats}/min
Rest HR: 76 {beats}/min
Rest Nuclear Isotope Dose: 10.5 mCi
SDS: 0
SRS: 0
SSS: 0
ST Depression (mm): 0 mm
Stress Nuclear Isotope Dose: 31.7 mCi
TID: 1

## 2024-10-14 LAB — ECHOCARDIOGRAM COMPLETE
Area-P 1/2: 3.77 cm2
S' Lateral: 2.9 cm

## 2024-10-14 MED ORDER — TECHNETIUM TC 99M TETROFOSMIN IV KIT
10.5000 | PACK | Freq: Once | INTRAVENOUS | Status: AC | PRN
  Administered 2024-10-14: 10.5 via INTRAVENOUS

## 2024-10-14 MED ORDER — TECHNETIUM TC 99M TETROFOSMIN IV KIT
31.7000 | PACK | Freq: Once | INTRAVENOUS | Status: AC | PRN
  Administered 2024-10-14: 31.7 via INTRAVENOUS

## 2024-10-14 MED ORDER — REGADENOSON 0.4 MG/5ML IV SOLN
0.4000 mg | Freq: Once | INTRAVENOUS | Status: AC
  Administered 2024-10-14: 0.4 mg via INTRAVENOUS

## 2024-10-14 MED ORDER — REGADENOSON 0.4 MG/5ML IV SOLN
INTRAVENOUS | Status: AC
  Filled 2024-10-14: qty 5

## 2024-10-21 ENCOUNTER — Ambulatory Visit: Payer: Self-pay | Admitting: Cardiology

## 2024-10-27 ENCOUNTER — Encounter: Payer: Self-pay | Admitting: Pulmonary Disease

## 2024-10-27 ENCOUNTER — Ambulatory Visit: Admitting: Pulmonary Disease

## 2024-10-27 VITALS — BP 116/70 | HR 83 | Temp 97.8°F | Ht 62.0 in | Wt 163.0 lb

## 2024-10-27 DIAGNOSIS — J842 Lymphoid interstitial pneumonia: Secondary | ICD-10-CM

## 2024-10-27 DIAGNOSIS — Z5181 Encounter for therapeutic drug level monitoring: Secondary | ICD-10-CM

## 2024-10-27 DIAGNOSIS — M3500 Sicca syndrome, unspecified: Secondary | ICD-10-CM | POA: Diagnosis not present

## 2024-10-27 MED ORDER — BUDESONIDE-FORMOTEROL FUMARATE 160-4.5 MCG/ACT IN AERO: 2.0000 | INHALATION_SPRAY | Freq: Two times a day (BID) | RESPIRATORY_TRACT | 11 refills | Status: AC

## 2024-10-27 MED ORDER — MYCOPHENOLATE MOFETIL 500 MG PO TABS: 1000.0000 mg | ORAL_TABLET | Freq: Two times a day (BID) | ORAL | 1 refills | Status: AC

## 2024-10-27 MED ORDER — ALBUTEROL SULFATE HFA 108 (90 BASE) MCG/ACT IN AERS: 2.0000 | INHALATION_SPRAY | Freq: Four times a day (QID) | RESPIRATORY_TRACT | 3 refills | Status: AC | PRN

## 2024-10-27 NOTE — Progress Notes (Signed)
 Donna Fields    [id number]    28-Sep-1961  Primary Care Physician:Ramachandran, Lequita, MD  Referring Physician: Verdia Lequita, MD [address] [place],  [place] [address]  Problem list:   Interstitial lung disease, LIP Sjogren's syndrome On Imuran since 2019, switched to Cellcept  July 2021  HPI: 63 y.o. with history of Sjogren's syndrome.  This was diagnosed in her 30s Noted to have interstitial lung disease consistent with likely about 4 years ago. Started on Imuran in 2019 by Dr. Curt, rheumatology CT scan and PFTs show progressive worsening over the years and has been referred for help with management Switch to CellCept  in July 2021. Off Bactrim  as she cannot tolerate the medication.  Chief complaint is dyspnea on exertion, occasional dizziness.  No cough, sputum production.  Denies any lower extremity weakness.  Interim history: Discussed the use of AI scribe software for clinical note transcription with the patient, who gave verbal consent to proceed. History of Present Illness  Donna Fields is a 63 year old female with interstitial lung disease who presents for follow-up.  Dyspnea and respiratory symptoms - Increased shortness of breath in cold weather - No overall worsening of breathing - No acute respiratory distress  Interstitial lung disease - Suspected lymphocytic interstitial pneumonia related to Sjogren's syndrome - Cardiac CT performed 2 weeks ago showed stable lung findings compared to chest CT earlier this year  Immunosuppressive therapy - Cellcept  1000 mg daily - No reported side effects - Liver panel recommended for monitoring  Inhaled and rescue medications - Uses Symbicort , 2 puffs morning and evening - Uncertain benefit from Symbicort  - Uses albuterol  for acute symptoms   Relevant pulmonary history Pets: Cats.  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.   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 Exposures: No mold, hot tub, Jacuzzi.  No feather pillows or comforters Smoking history: Never smoker Travel history: No significant travel Relevant family history: No significant family history of lung disease  Outpatient Encounter Medications as of 10/27/2024  Medication Sig   acyclovir (ZOVIRAX) 400 MG tablet Take 400 mg by mouth 2 (two) times daily.   albuterol  (VENTOLIN  HFA) 108 (90 Base) MCG/ACT inhaler Inhale 2 puffs into the lungs every 6 (six) hours as needed for wheezing or shortness of breath.   budesonide -formoterol  (SYMBICORT ) 160-4.5 MCG/ACT inhaler Inhale 2 puffs into the lungs 2 (two) times daily.   Cetirizine HCl (ZYRTEC ALLERGY) 10 MG CAPS Take by mouth.   cholecalciferol (VITAMIN D) 1000 UNITS tablet Take 1,000 Units by mouth daily.   DULoxetine (CYMBALTA) 60 MG capsule Take 60 mg by mouth daily.   ipratropium (ATROVENT) 0.03 % nasal spray Place 2 sprays into the nose 2 (two) times daily.   meloxicam (MOBIC) 15 MG tablet Take 15 mg by mouth daily.   mycophenolate  (CELLCEPT ) 500 MG tablet Take 2 tablets (1,000 mg total) by mouth 2 (two) times daily.   nebivolol (BYSTOLIC) 2.5 MG tablet Take 2.5 mg by mouth daily.   omeprazole  (PRILOSEC) 20 MG capsule TAKE 1 CAPSULE (20 MG TOTAL) BY MOUTH 2 (TWO) TIMES DAILY BEFORE A MEAL.   pravastatin (PRAVACHOL) 20 MG tablet Take 20 mg by mouth daily.   Probiotic Product (PROBIOTIC-10 PO)    XIIDRA 5 % SOLN 2 drops 2 (two) times daily.   No facility-administered encounter medications on file as of 10/27/2024.  Vitals:   10/27/24 1520  BP: 116/70  Pulse: 83  Temp: 97.8 F (36.6 C)  Height: 5' 2 (1.575 m)  Weight: 163 lb (73.9 kg)  SpO2: 96%  TempSrc: Oral  BMI (Calculated): 29.81    Physical Exam GEN: No acute distress CV: Regular rate and rhythm no murmurs LUNGS: Clear to auscultation bilaterally normal respiratory effort SKIN JOINTS: Warm and dry no rash     Data Reviewed: Imaging: High-res CT 05/31/2019-numerous thin-walled pulmonary cysts with basal predominance, superimposed nodular and masslike pulmonary opacities worsening compared to 2019  High-res CT 03/23/2020-slight worsening of pulmonary cysts.  Stable lung nodules.  High-res CT 08/02/2021-redemonstrated cystic changes, pulmonary nodules.  Decreased density of right upper lobe pulmonary nodule  High-res CT 01/03/2023-cystic lung disease, pulmonary nodules that are stable.  High-res CT 02/23/2024-stable changes of cystic lung disease and pulmonary nodules, atherosclerosis of LAD  Cardiac CT 10/14/2024-chronic cystic lung disease stable compared to before I have reviewed the images personally.  PFTs: 03/27/2020 FVC 2.14 [69%], FEV1 1.62 [67%], F/F 76, TLC 5.21 [108%], DLCO 15.02 [78%] Minimal obstruction, minimal diffusion defect.  08/27/2021 FVC 1.86 [60%], FEV1 1.46 [61%], F/F 79, TLC 5.03 [25%], DLCO 15.20 [80%] Normal test.  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 Lung function is slightly reduced but well-managed. Recent cardiac CT showed no progression on lung fields. Current treatment with Cellcept  is well-tolerated without side effects. Symbicort  use is uncertain in efficacy for her condition. - Continue Cellcept  1000 mg daily. - Requested liver panel and complete blood count with next appointment with her primary.  Her last labs in August  was normal - Taper Symbicort  to one puff in the morning and one puff in the evening, then every other day, and discontinue if symptoms do not worsen. - Will schedule CT of the chest in April or May next year. - Will perform lung function test before return visit  Sjogren's syndrome Sjogren's syndrome is well-managed with Cellcept . She is under the care of a rheumatologist, Dr. Curt.   Plan/Recommendations: Follow-up in 6 months with CT and PFTs Labs for monitoring blood counts, hepatic panel Continue CellCept   Lonna Coder MD [facility] 10/27/2024, 3:27 PM  CC: Verdia Lombard, MD

## 2024-10-27 NOTE — Patient Instructions (Addendum)
  VISIT SUMMARY: Today, we discussed your interstitial lung disease and related symptoms. Your lung function remains stable, and we reviewed your current medications and their effectiveness.  YOUR PLAN: LYMPHOCYTIC INTERSTITIAL PNEUMONIA: This is a type of lung disease associated with Sjogren's syndrome. Your lung function is slightly reduced but stable. -Continue taking Cellcept  1000 mg daily. -We will do a liver panel and complete blood count at your next appointment to monitor your health. -Gradually reduce Symbicort  to one puff in the morning and one puff in the evening, then every other day. Stop using it if your symptoms do not get worse. -We will schedule a CT of your chest in April or May next year. -We will perform a lung function test at your next CT appointment.

## 2024-11-01 ENCOUNTER — Ambulatory Visit: Admitting: Pulmonary Disease

## 2024-12-23 ENCOUNTER — Emergency Department (HOSPITAL_BASED_OUTPATIENT_CLINIC_OR_DEPARTMENT_OTHER)

## 2024-12-23 ENCOUNTER — Ambulatory Visit
Admission: EM | Admit: 2024-12-23 | Discharge: 2024-12-23 | Disposition: A | Discharge status: Home / Self Care | Source: Home / Self Care

## 2024-12-23 ENCOUNTER — Other Ambulatory Visit: Payer: Self-pay

## 2024-12-23 ENCOUNTER — Encounter (HOSPITAL_BASED_OUTPATIENT_CLINIC_OR_DEPARTMENT_OTHER): Payer: Self-pay | Admitting: Emergency Medicine

## 2024-12-23 ENCOUNTER — Ambulatory Visit (INDEPENDENT_AMBULATORY_CARE_PROVIDER_SITE_OTHER)

## 2024-12-23 ENCOUNTER — Encounter: Payer: Self-pay | Admitting: Emergency Medicine

## 2024-12-23 ENCOUNTER — Emergency Department (HOSPITAL_BASED_OUTPATIENT_CLINIC_OR_DEPARTMENT_OTHER)
Admission: EM | Admit: 2024-12-23 | Discharge: 2024-12-23 | Disposition: A | Discharge status: Home / Self Care | Attending: Emergency Medicine | Admitting: Emergency Medicine

## 2024-12-23 DIAGNOSIS — Z79899 Other long term (current) drug therapy: Secondary | ICD-10-CM | POA: Diagnosis not present

## 2024-12-23 DIAGNOSIS — M3502 Sicca syndrome with lung involvement: Secondary | ICD-10-CM | POA: Insufficient documentation

## 2024-12-23 DIAGNOSIS — I2510 Atherosclerotic heart disease of native coronary artery without angina pectoris: Secondary | ICD-10-CM | POA: Diagnosis not present

## 2024-12-23 DIAGNOSIS — R0602 Shortness of breath: Secondary | ICD-10-CM | POA: Diagnosis present

## 2024-12-23 DIAGNOSIS — E039 Hypothyroidism, unspecified: Secondary | ICD-10-CM | POA: Insufficient documentation

## 2024-12-23 DIAGNOSIS — R051 Acute cough: Secondary | ICD-10-CM | POA: Diagnosis present

## 2024-12-23 DIAGNOSIS — I10 Essential (primary) hypertension: Secondary | ICD-10-CM | POA: Insufficient documentation

## 2024-12-23 DIAGNOSIS — J849 Interstitial pulmonary disease, unspecified: Secondary | ICD-10-CM

## 2024-12-23 DIAGNOSIS — J181 Lobar pneumonia, unspecified organism: Secondary | ICD-10-CM | POA: Insufficient documentation

## 2024-12-23 DIAGNOSIS — R059 Cough, unspecified: Secondary | ICD-10-CM | POA: Diagnosis present

## 2024-12-23 DIAGNOSIS — J189 Pneumonia, unspecified organism: Secondary | ICD-10-CM | POA: Insufficient documentation

## 2024-12-23 LAB — CBC WITH DIFFERENTIAL/PLATELET
Abs Immature Granulocytes: 0.01 10*3/uL (ref 0.00–0.07)
Basophils Absolute: 0 10*3/uL (ref 0.0–0.1)
Basophils Relative: 1 %
Eosinophils Absolute: 0.3 10*3/uL (ref 0.0–0.5)
Eosinophils Relative: 5 %
HCT: 35.5 % — ABNORMAL LOW (ref 36.0–46.0)
Hemoglobin: 11.5 g/dL — ABNORMAL LOW (ref 12.0–15.0)
Immature Granulocytes: 0 %
Lymphocytes Relative: 13 %
Lymphs Abs: 0.7 10*3/uL (ref 0.7–4.0)
MCH: 29.4 pg (ref 26.0–34.0)
MCHC: 32.4 g/dL (ref 30.0–36.0)
MCV: 90.8 fL (ref 80.0–100.0)
Monocytes Absolute: 0.4 10*3/uL (ref 0.1–1.0)
Monocytes Relative: 8 %
Neutro Abs: 4.1 10*3/uL (ref 1.7–7.7)
Neutrophils Relative %: 73 %
Platelets: 290 10*3/uL (ref 150–400)
RBC: 3.91 MIL/uL (ref 3.87–5.11)
RDW: 13.2 % (ref 11.5–15.5)
WBC: 5.5 10*3/uL (ref 4.0–10.5)
nRBC: 0 % (ref 0.0–0.2)

## 2024-12-23 LAB — COMPREHENSIVE METABOLIC PANEL WITH GFR
ALT: 13 U/L (ref 0–44)
AST: 21 U/L (ref 15–41)
Albumin: 4.3 g/dL (ref 3.5–5.0)
Alkaline Phosphatase: 86 U/L (ref 38–126)
Anion gap: 13 (ref 5–15)
BUN: 9 mg/dL (ref 8–23)
CO2: 25 mmol/L (ref 22–32)
Calcium: 9.9 mg/dL (ref 8.9–10.3)
Chloride: 99 mmol/L (ref 98–111)
Creatinine, Ser: 0.64 mg/dL (ref 0.44–1.00)
GFR, Estimated: >60 mL/min
Glucose, Bld: 146 mg/dL — ABNORMAL HIGH (ref 70–99)
Potassium: 3.8 mmol/L (ref 3.5–5.1)
Sodium: 138 mmol/L (ref 135–145)
Total Bilirubin: 0.2 mg/dL (ref 0.0–1.2)
Total Protein: 7.9 g/dL (ref 6.5–8.1)

## 2024-12-23 LAB — PRO BRAIN NATRIURETIC PEPTIDE: Pro Brain Natriuretic Peptide: 141 pg/mL

## 2024-12-23 LAB — TROPONIN T, HIGH SENSITIVITY
Troponin T High Sensitivity: <6 ng/L (ref 0–19)
Troponin T High Sensitivity: <6 ng/L (ref 0–19)

## 2024-12-23 LAB — POCT INFLUENZA A/B
Influenza A, POC: NEGATIVE
Influenza B, POC: NEGATIVE

## 2024-12-23 MED ORDER — AMOXICILLIN-POT CLAVULANATE 875-125 MG PO TABS: 1.0000 | ORAL_TABLET | Freq: Two times a day (BID) | ORAL | 0 refills | Status: AC

## 2024-12-23 MED ORDER — DOXYCYCLINE HYCLATE 100 MG PO TABS
100.0000 mg | ORAL_TABLET | Freq: Once | ORAL | Status: AC
  Administered 2024-12-23: 100 mg via ORAL
  Filled 2024-12-23: qty 1

## 2024-12-23 MED ORDER — AMOXICILLIN-POT CLAVULANATE 875-125 MG PO TABS
1.0000 | ORAL_TABLET | Freq: Once | ORAL | Status: AC
  Administered 2024-12-23: 1 via ORAL
  Filled 2024-12-23: qty 1

## 2024-12-23 MED ORDER — PREDNISONE 10 MG PO TABS: 40.0000 mg | ORAL_TABLET | Freq: Every day | ORAL | 0 refills | Status: AC

## 2024-12-23 MED ORDER — IPRATROPIUM-ALBUTEROL 0.5-2.5 (3) MG/3ML IN SOLN
3.0000 mL | Freq: Once | RESPIRATORY_TRACT | Status: AC
  Administered 2024-12-23: 3 mL via RESPIRATORY_TRACT
  Filled 2024-12-23: qty 3

## 2024-12-23 MED ORDER — METHYLPREDNISOLONE SODIUM SUCC 125 MG IJ SOLR
125.0000 mg | Freq: Once | INTRAMUSCULAR | Status: AC
  Administered 2024-12-23: 125 mg via INTRAVENOUS
  Filled 2024-12-23: qty 2

## 2024-12-23 MED ORDER — DOXYCYCLINE HYCLATE 100 MG PO CAPS: 100.0000 mg | ORAL_CAPSULE | Freq: Two times a day (BID) | ORAL | 0 refills | Status: AC

## 2024-12-23 MED ORDER — IPRATROPIUM-ALBUTEROL 0.5-2.5 (3) MG/3ML IN SOLN
3.0000 mL | Freq: Once | RESPIRATORY_TRACT | Status: AC
  Administered 2024-12-23: 3 mL via RESPIRATORY_TRACT

## 2024-12-23 MED ORDER — IOHEXOL 350 MG/ML SOLN
75.0000 mL | Freq: Once | INTRAVENOUS | Status: AC | PRN
  Administered 2024-12-23: 75 mL via INTRAVENOUS

## 2024-12-23 NOTE — ED Notes (Signed)
 I walked patient around after breathing treatment and oxygen level was 97%

## 2024-12-23 NOTE — ED Triage Notes (Signed)
 Patient presents for cough, shortness of breathe x1 and has gotten worst in the last 2 to 3 days.  Patient can not walk across the room without her oxygen dropping.  Patient has been taken over the counter medication and tylenol.  Patient has using cough medication that was prescribed and inhaler and it is not helping.  Patient took a covid test and it was negative

## 2024-12-23 NOTE — Discharge Instructions (Signed)
 Please go to the ER for further evaluation of your cough, shortness of breath, and fluctuating oxygen levels.

## 2024-12-23 NOTE — ED Triage Notes (Signed)
 Cough, sob x week worse in last few days See at UC given neb, neg for flu, neg covid at home Cxr done at [facility]

## 2024-12-23 NOTE — ED Provider Notes (Signed)
 " [place] EMERGENCY DEPARTMENT AT [facility] Provider Note   CSN: [id number] Arrival date & time: 12/23/24  1654     Patient presents with: Shortness of Breath   Donna Fields is a 64 y.o. female.  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  Fighting bronchitis or something most of the month, early in month cough, dyspnea, had abx, felt better than worsened Coughing and shortness of breath came on strong and getting progressively worse Couldn't go across the room without feeling dyspnea No chest pain  Coughing attacks, and pain with that but otherwise no chest pain No fever No nausea, vomiting, diarrhea No leg pain or swelling   Inhaler at home, using it a lot but not helping.  In recliner because cough severe with laying down , wears cpap but it is inducing coughing right now with air tickling throat.    Received nebulizer treatments here and felt better.   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 Authorizing Provider  acyclovir (ZOVIRAX) 400 MG tablet Take 400 mg by mouth 2 (two) times daily.    [provider]  albuterol  (VENTOLIN  HFA) 108 (90 Base) MCG/ACT inhaler Inhale 2 puffs into the lungs every 6 (six) hours as needed for wheezing or shortness of breath. 10/27/24   Mannam, Praveen, MD  budesonide -formoterol  (SYMBICORT ) 160-4.5 MCG/ACT inhaler Inhale 2 puffs into the lungs 2 (two) times daily. 10/27/24   Mannam, Praveen, MD  Cetirizine HCl (ZYRTEC ALLERGY) 10 MG CAPS Take by mouth.    [provider]  cholecalciferol (VITAMIN D) 1000 UNITS tablet Take 1,000 Units by mouth daily.    [provider]  DULoxetine (CYMBALTA) 60 MG capsule Take 60 mg by mouth daily.    [provider]  ipratropium (ATROVENT) 0.03 % nasal spray Place 2 sprays into the nose 2 (two) times daily. 01/18/18   [provider]  meloxicam (MOBIC) 15 MG tablet Take 15 mg by mouth daily.    [provider]  mycophenolate  (CELLCEPT ) 500 MG tablet Take 2 tablets (1,000 mg total) by mouth 2 (two) times daily. 10/27/24   Mannam, Praveen, MD  nebivolol (BYSTOLIC) 2.5 MG tablet Take 2.5 mg by mouth daily.    [provider]  omeprazole  (PRILOSEC) 20 MG capsule TAKE 1 CAPSULE (20 MG TOTAL) BY MOUTH 2 (TWO) TIMES DAILY BEFORE A MEAL. 07/01/19   Darlean Ozell NOVAK, MD  pravastatin (PRAVACHOL) 20 MG tablet Take 20 mg by mouth daily. 04/07/24   [provider]  Probiotic Product (PROBIOTIC-10 PO)     [provider]  XIIDRA 5 % SOLN 2 drops 2 (two) times daily. 02/27/20   [provider]    Allergies: Valtrex [valacyclovir]    Review of Systems  Updated Vital Signs BP (!) 164/104 (BP Location: Right Arm)   Pulse (!) 106   Temp (!) 97.5 F (36.4 C)   Resp (!) 24   SpO2 94%   Physical Exam  (all labs ordered are listed, but only abnormal results are displayed) Labs Reviewed - No data  to display  EKG: None  Radiology: DG Chest 2 View Result Date: 12/23/2024 EXAM: 2 VIEW(S) XRAY OF THE CHEST 12/23/2024 03:53:56 PM COMPARISON: CT chests without contrast 12/31/2022. CLINICAL HISTORY: Cough and low O2; history of interstitial lung disease. FINDINGS: LUNGS AND PLEURA: Centrilobular emphysematous changes are present. Bilateral pulmonary nodules at both lung bases are similar to the prior exams. No pleural effusion. No pneumothorax. HEART AND MEDIASTINUM: No acute abnormality of the cardiac and mediastinal silhouettes. BONES AND SOFT TISSUES: No acute osseous abnormality. IMPRESSION: 1. No acute findings. 2. Centrilobular emphysematous changes and bilateral pulmonary nodules at both lung bases, similar to prior exams. Electronically signed by: Lonni Necessary MD 12/23/2024 04:49 PM EST RP Workstation: [id number]    {Document cardiac monitor, telemetry assessment procedure when appropriate:32947} Procedures   Medications Ordered in the ED - No data to display    {Click here for ABCD2, HEART and other calculators REFRESH Note before signing:1}                              Medical Decision Making Amount and/or Complexity of Data Reviewed Labs: ordered. Radiology: ordered.  Risk Prescription drug management.   ***    CT completed shows no evidence of PE, new left lower lobe coarsely nodular opacity medially and pleural-based airspace consolidation underlying otherwise unchanged left lower lobe nodular focus, favored to be inflammatory/infectious--follow-up CT recommended in 2 months to ensure clearing.  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  {Document critical care time when appropriate  Document review of labs and clinical decision tools ie CHADS2VASC2, etc  Document your independent review of radiology images and any outside records  Document your discussion with family members, caretakers and with consultants  Document social  determinants of health affecting pt's care  Document your decision making why or why not admission, treatments were needed:32947:::1}   Final diagnoses:  None    ED Discharge Orders     None        "

## 2024-12-23 NOTE — ED Provider Notes (Signed)
 " [facility]    CSN: [id number] Arrival date & time: 12/23/24  1452      History   Chief Complaint Chief Complaint  Patient presents with   Shortness of Breath   Cough    HPI Donna Fields is a 64 y.o. female with a complex past medical history of Sojourn's syndrome with interstitial lung disease, asthma, CAD, hyperlipidemia, fibromyalgia, osteoarthritis, prediabetes presents for cough and shortness of breath.  Patient reports she has been having an intermittent cough since the beginning of January which is not necessarily unusual to her.  Her PCP called her in a prescription for doxycycline  at the beginning of January which she took with slight improvement in symptoms.  States 4 days ago she began to develop worsening shortness of breath and states she can no longer lie flat and any mild exertion causes shortness of breath.  She does not wear oxygen at home.  She uses Symbicort  and her albuterol  which is temporary improvement.  Denies hospitalization in the past year for any interstitial lung issues.  She has a scheduled CT of her chest scheduled soon.  She has been using over-the-counter cough medicine without improvement.  She also does wear CPAP but states due to all this congestion she cannot wear it recently.  Reports a negative home covid test. No other concerns.   Shortness of Breath Associated symptoms: cough and fever   Cough Associated symptoms: fever and shortness of breath     Past Medical History:  Diagnosis Date   CAD (coronary artery disease)    Chronic obstructive pulmonary disease, unspecified (HCC) 12/20/2015   Dyspnea on exertion 05/03/2021   Essential hypertension 05/03/2021   Fibromyalgia 05/03/2021   Hyperglycemia 05/03/2021   Hyperlipidemia 05/03/2021   Hypothyroidism 05/03/2021   Interstitial lung disease (HCC) 05/03/2021   Low back pain 05/03/2021   Lymphadenopathy 05/03/2021   Osteoarthritis 05/03/2021   Parietoalveolar pneumopathy  (HCC) 05/03/2021   Pneumonia    Prediabetes 05/03/2021   Pure hypercholesterolemia 05/03/2021   Shingles 05/03/2021   Sjogren's syndrome    Snoring 03/29/2020   Vitamin D deficiency 05/03/2021    Patient Active Problem List   Diagnosis Date Noted   Acute frontal sinusitis 05/03/2021   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   Fibromyalgia 05/03/2021   Low back pain 05/03/2021   Dyspnea on exertion 05/03/2021   Essential hypertension 05/03/2021   Hyperglycemia 05/03/2021   Hyperlipidemia 05/03/2021   Hypothyroidism 05/03/2021   Interstitial lung disease (HCC) 05/03/2021   Lymphadenopathy 05/03/2021   Moderate persistent asthma with (acute) exacerbation 05/03/2021   Osteoarthritis 05/03/2021   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      Home Medications    Prior to Admission medications  Medication Sig Start Date End Date Taking? Authorizing Provider  acyclovir (ZOVIRAX) 400 MG tablet Take 400 mg by mouth 2 (two) times daily.    [provider]  albuterol  (VENTOLIN  HFA) 108 (90 Base) MCG/ACT inhaler  Inhale 2 puffs into the lungs every 6 (six) hours as needed for wheezing or shortness of breath. 10/27/24   Mannam, Praveen, MD  budesonide -formoterol  (SYMBICORT ) 160-4.5 MCG/ACT inhaler Inhale 2 puffs into the lungs 2 (two) times daily. 10/27/24   Mannam, Praveen, MD  Cetirizine HCl (ZYRTEC ALLERGY) 10 MG CAPS Take by  mouth.    [provider]  cholecalciferol (VITAMIN D) 1000 UNITS tablet Take 1,000 Units by mouth daily.    [provider]  DULoxetine (CYMBALTA) 60 MG capsule Take 60 mg by mouth daily.    [provider]  ipratropium (ATROVENT) 0.03 % nasal spray Place 2 sprays into the nose 2 (two) times daily. 01/18/18   [provider]  meloxicam (MOBIC) 15 MG tablet Take 15 mg by mouth daily.    [provider]  mycophenolate  (CELLCEPT ) 500 MG tablet Take 2 tablets (1,000 mg total) by mouth 2 (two) times daily. 10/27/24   Mannam, Praveen, MD  nebivolol (BYSTOLIC) 2.5 MG tablet Take 2.5 mg by mouth daily.    [provider]  omeprazole  (PRILOSEC) 20 MG capsule TAKE 1 CAPSULE (20 MG TOTAL) BY MOUTH 2 (TWO) TIMES DAILY BEFORE A MEAL. 07/01/19   Darlean Ozell NOVAK, MD  pravastatin (PRAVACHOL) 20 MG tablet Take 20 mg by mouth daily. 04/07/24   [provider]  Probiotic Product (PROBIOTIC-10 PO)     [provider]  XIIDRA 5 % SOLN 2 drops 2 (two) times daily. 02/27/20   [provider]    Family History Family History  Problem Relation Age of Onset   Breast cancer Mother    Skin cancer Mother    Heart disease Father    Rheum arthritis Father    Allergies Brother     Social History Social History[1]   Allergies   Valtrex [valacyclovir]   Review of Systems Review of Systems  Constitutional:  Positive for fever.  HENT:  Positive for congestion.   Respiratory:  Positive for cough and shortness of breath.      Physical Exam Triage Vital Signs ED Triage Vitals  Encounter Vitals Group     BP 12/23/24 1527 (!) 145/98     Girls Systolic BP Percentile --      Girls Diastolic BP Percentile --      Boys Systolic BP Percentile --      Boys Diastolic BP Percentile --      Pulse Rate 12/23/24 1527 92     Resp 12/23/24 1527 20     Temp 12/23/24 1527 99.3 F (37.4 C)     Temp Source 12/23/24 1527 Oral     SpO2 12/23/24 1527  90 %     Weight --      Height --      Head Circumference --      Peak Flow --      Pain Score 12/23/24 1525 5     Pain Loc --      Pain Education --      Exclude from Growth Chart --    No data found.  Updated Vital Signs BP (!) 145/98 (BP Location: Left Arm)   Pulse 92   Temp 99.3 F (37.4 C) (Oral)   Resp 20   SpO2 93%   Visual Acuity Right Eye Distance:   Left Eye Distance:   Bilateral Distance:    Right Eye Near:   Left Eye Near:    Bilateral Near:     Physical Exam Vitals and  nursing note reviewed.  Constitutional:      General: She is not in acute distress.    Appearance: She is well-developed. She is not ill-appearing.  HENT:     Head: Normocephalic and atraumatic.     Right Ear: Tympanic membrane and ear canal normal.     Left Ear: Tympanic membrane and ear canal normal.     Nose: Congestion present.     Mouth/Throat:     Mouth: Mucous membranes are moist.     Pharynx: Oropharynx is clear. Uvula midline. No oropharyngeal exudate or posterior oropharyngeal erythema.     Tonsils: No tonsillar exudate or tonsillar abscesses.  Eyes:     Conjunctiva/sclera: Conjunctivae normal.     Pupils: Pupils are equal, round, and reactive to light.  Cardiovascular:     Rate and Rhythm: Normal rate and regular rhythm.     Heart sounds: Normal heart sounds.  Pulmonary:     Effort: Pulmonary effort is normal. No respiratory distress.     Breath sounds: No stridor. Wheezing present. No rhonchi or rales.     Comments: Diffuse wheezing, minimal improvement after nebulizer  Musculoskeletal:     Cervical back: Normal range of motion and neck supple.  Lymphadenopathy:     Cervical: No cervical adenopathy.  Skin:    General: Skin is warm and dry.  Neurological:     General: No focal deficit present.     Mental Status: She is alert and oriented to person, place, and time.  Psychiatric:        Mood and Affect: Mood normal.        Behavior: Behavior normal.      UC  Treatments / Results  Labs (all labs ordered are listed, but only abnormal results are displayed) Labs Reviewed  POCT INFLUENZA A/B - Normal    EKG   Radiology No results found.  Procedures Procedures (including critical care time)  Medications Ordered in UC Medications  ipratropium-albuterol  (DUONEB) 0.5-2.5 (3) MG/3ML nebulizer solution 3 mL (3 mLs Nebulization Given 12/23/24 1600)    Initial Impression / Assessment and Plan / UC Course  I have reviewed the triage vital signs and the nursing notes.  Pertinent labs & imaging results that were available during my care of the patient were reviewed by me and considered in my medical decision making (see chart for details).     O2 on intake was 87% on room air.  Placed on 2 L O2 with improvement to 93%. Negative flu testing and pt had negative home covid test.   after nebulizer O2 93 to 94% room air but patient still reports shortness of breath and she still has diffuse wheezing on exam.  She does report at home her O2 fluctuate into the high 80s to low 90s.  She states normally she is 95% or above.  Given her medical history including interstitial lung disease her fluctuating O2 and her worsening shortness of breath advised ER evaluation.  She is in agreement with plan and she declined EMS transfer stating her husband will drive her.  Discussed risks of going POV include worsening shortness of breath/hypoxia, permanent disability and/or death.  O2 at time of discharge was 93% on RA.  Final Clinical Impressions(s) / UC Diagnoses   Final diagnoses:  Acute cough  Shortness of breath  Interstitial lung disease (HCC)     Discharge Instructions      Please go to the ER for further evaluation of your cough, shortness of breath,  and fluctuating oxygen levels.      ED Prescriptions   None    PDMP not reviewed this encounter.     [1]  Social History Tobacco Use   Smoking status: Never   Smokeless tobacco: Never  Vaping  Use   Vaping status: Never Used  Substance Use Topics   Alcohol use: Yes    Alcohol/week: 0.0 standard drinks of alcohol    Comment: wine 2 x per wk   Drug use: No     Loreda Myla SAUNDERS, NP 12/23/24 1620  "

## 2024-12-24 ENCOUNTER — Ambulatory Visit (HOSPITAL_COMMUNITY): Payer: Self-pay

## 2025-04-24 ENCOUNTER — Other Ambulatory Visit
